# Patient Record
Sex: Female | Born: 2000
Health system: Southern US, Community
[De-identification: ages and names within clinical notes are randomized; demographics above are authoritative.]

## PROBLEM LIST (undated history)

## (undated) HISTORY — PX: WISDOM TOOTH EXTRACTION: SHX21

---
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
## 2018-05-27 LAB — LIPID PANEL
Cholesterol: 199 (ref 0–200)
HDL: 71 — AB (ref 35–70)
LDL Cholesterol: 109
Triglycerides: 93 (ref 40–160)

## 2019-05-26 ENCOUNTER — Encounter: Payer: Self-pay | Admitting: Family Medicine

## 2019-05-26 ENCOUNTER — Other Ambulatory Visit: Payer: Self-pay

## 2019-05-26 ENCOUNTER — Ambulatory Visit (INDEPENDENT_AMBULATORY_CARE_PROVIDER_SITE_OTHER): Payer: BC Managed Care – PPO | Admitting: Family Medicine

## 2019-05-26 VITALS — BP 118/80 | HR 91 | Temp 98.1°F | Ht 60.75 in | Wt 101.2 lb

## 2019-05-26 DIAGNOSIS — Z0000 Encounter for general adult medical examination without abnormal findings: Secondary | ICD-10-CM

## 2019-05-26 NOTE — Progress Notes (Addendum)
Carolyn Palmer DOB: 03-Apr-2001 Encounter date: 05/26/2019  This is a 18 y.o. female who presents to establish care. Chief Complaint  Patient presents with  . Establish Care    History of present illness: Previously only saw pediatrician.   No specific concerns today.   She is going to school for [employer].   She tracks cycle and uses condoms for birth control. Gets bad cramps with periods. Periods are regular. Bleeds for 6 days during cycle with 3 heavier days. Changes q 3 hours on heavy days. Is following with gynecology next month.   She does exercise regularly. Enjoys running.   Did have cholesterol checked last summer through pediatrician.   History reviewed. No pertinent past medical history. Past Surgical History:  Procedure Laterality Date  . WISDOM TOOTH EXTRACTION     No Known Allergies No outpatient medications have been marked as taking for the 05/26/19 encounter (Office Visit) with Caren Macadam, MD.   Social History   Tobacco Use  . Smoking status: Never Smoker  . Smokeless tobacco: Never Used  Substance Use Topics  . Alcohol use: Yes    Comment: hard seltzer every other weekend while in college   Family History  Problem Relation Age of Onset  . Hypertension Mother   . Hypertension Father   . Colon cancer Maternal Grandfather 62  . Diabetes Maternal Grandfather   . Diabetes Paternal Grandmother   . Heart disease Paternal Grandfather   . Healthy Sister   . Healthy Brother      Review of Systems  Constitutional: Negative for activity change, appetite change, chills, fatigue, fever and unexpected weight change.  HENT: Negative for congestion, ear pain, hearing loss, sinus pressure, sinus pain, sore throat and trouble swallowing.   Eyes: Negative for pain and visual disturbance.  Respiratory: Negative for cough, chest tightness, shortness of breath and wheezing.   Cardiovascular: Negative for chest pain, palpitations and leg swelling.   Gastrointestinal: Negative for abdominal pain, blood in stool, constipation, diarrhea, nausea and vomiting.  Genitourinary: Negative for difficulty urinating and menstrual problem.  Musculoskeletal: Negative for arthralgias and back pain.  Skin: Negative for rash.  Neurological: Negative for dizziness, weakness, numbness and headaches.  Hematological: Negative for adenopathy. Does not bruise/bleed easily.  Psychiatric/Behavioral: Negative for sleep disturbance and suicidal ideas. The patient is not nervous/anxious.     Objective:  BP 118/80 (BP Location: Left Arm, Patient Position: Sitting, Cuff Size: Normal)   Pulse 91   Temp 98.1 F (36.7 C) (Oral)   Ht 5' 0.75" (1.543 m)   Wt 101 lb 3.2 oz (45.9 kg)   LMP 05/23/2019 (Exact Date)   SpO2 97%   BMI 19.28 kg/m   Weight: 101 lb 3.2 oz (45.9 kg)   BP Readings from Last 3 Encounters:  05/26/19 118/80   Wt Readings from Last 3 Encounters:  05/26/19 101 lb 3.2 oz (45.9 kg) (4 %, Z= -1.71)*   * Growth percentiles are based on CDC (Girls, 2-20 Years) data.    Physical Exam Vitals signs reviewed.  Constitutional:      General: She is not in acute distress.    Appearance: She is well-developed.  HENT:     Head: Normocephalic and atraumatic.     Right Ear: External ear normal.     Left Ear: External ear normal.     Mouth/Throat:     Pharynx: No oropharyngeal exudate.     Tonsils: 1+ on the right. 2+ on the left.  Eyes:  Conjunctiva/sclera: Conjunctivae normal.     Pupils: Pupils are equal, round, and reactive to light.  Neck:     Musculoskeletal: Normal range of motion and neck supple.     Thyroid: No thyromegaly.  Cardiovascular:     Rate and Rhythm: Normal rate and regular rhythm.     Heart sounds: Normal heart sounds. No murmur. No friction rub. No gallop.   Pulmonary:     Effort: Pulmonary effort is normal.     Breath sounds: Normal breath sounds.  Abdominal:     General: Bowel sounds are normal. There is no  distension.     Palpations: Abdomen is soft. There is no mass.     Tenderness: There is no abdominal tenderness. There is no guarding.     Hernia: No hernia is present.  Musculoskeletal: Normal range of motion.        General: No tenderness or deformity.  Lymphadenopathy:     Cervical: No cervical adenopathy.  Skin:    General: Skin is warm and dry.     Findings: No rash.  Neurological:     Mental Status: She is alert and oriented to person, place, and time.     Deep Tendon Reflexes: Reflexes normal.     Reflex Scores:      Tricep reflexes are 2+ on the right side and 2+ on the left side.      Bicep reflexes are 2+ on the right side and 2+ on the left side.      Brachioradialis reflexes are 2+ on the right side and 2+ on the left side.      Patellar reflexes are 2+ on the right side and 2+ on the left side. Psychiatric:        Speech: Speech normal.        Behavior: Behavior normal.        Thought Content: Thought content normal.    Female exam deferred since she plans to see gyn this month  Assessment/Plan:  1. Preventative health care Healthy young woman with no significant medical history.  She would like to discuss starting birth control with gynecologist and has upcoming appointment for this.  Encouraged her to stay active with regular exercise.  She will sign record release upfront so that we can get blood work she had completed last year, which included cholesterol.  Follow-up in 1 year.  Return in about 1 year (around 05/25/2020) for physical exam.  Micheline Rough, MD

## 2019-06-04 ENCOUNTER — Telehealth: Payer: Self-pay | Admitting: *Deleted

## 2019-06-04 NOTE — Telephone Encounter (Signed)
Copied from [place] [id number]. Topic: General - Other >> Jun 04, 2019  3:15 PM Yvette Rack wrote: Reason for CRM: [place] with [facility] called to see if the office received the fax. Cb# [phone]

## 2019-06-08 NOTE — Telephone Encounter (Signed)
I called [employer] and informed Tiffany our fax has been down and if they received a confirmation the records should be coming through as the machine was fixed today per Joycelyn Schmid.

## 2019-08-10 ENCOUNTER — Encounter: Payer: Self-pay | Admitting: Family Medicine

## 2019-12-27 DIAGNOSIS — Z20828 Contact with and (suspected) exposure to other viral communicable diseases: Secondary | ICD-10-CM | POA: Diagnosis not present

## 2019-12-28 ENCOUNTER — Other Ambulatory Visit: Payer: Self-pay | Admitting: Obstetrics and Gynecology

## 2019-12-28 DIAGNOSIS — N6310 Unspecified lump in the right breast, unspecified quadrant: Secondary | ICD-10-CM

## 2019-12-30 ENCOUNTER — Other Ambulatory Visit: Payer: Self-pay

## 2019-12-30 ENCOUNTER — Other Ambulatory Visit: Payer: Self-pay | Admitting: Obstetrics and Gynecology

## 2019-12-30 ENCOUNTER — Ambulatory Visit
Admission: RE | Admit: 2019-12-30 | Discharge: 2019-12-30 | Disposition: A | Discharge status: Home / Self Care | Payer: BC Managed Care – PPO | Source: Ambulatory Visit | Attending: Obstetrics and Gynecology | Admitting: Obstetrics and Gynecology

## 2019-12-30 DIAGNOSIS — N6310 Unspecified lump in the right breast, unspecified quadrant: Secondary | ICD-10-CM

## 2019-12-30 DIAGNOSIS — N6314 Unspecified lump in the right breast, lower inner quadrant: Secondary | ICD-10-CM | POA: Diagnosis not present

## 2020-01-13 ENCOUNTER — Other Ambulatory Visit: Payer: Self-pay

## 2020-01-13 ENCOUNTER — Ambulatory Visit
Admission: RE | Admit: 2020-01-13 | Discharge: 2020-01-13 | Disposition: A | Discharge status: Home / Self Care | Payer: BC Managed Care – PPO | Source: Ambulatory Visit | Attending: Obstetrics and Gynecology | Admitting: Obstetrics and Gynecology

## 2020-01-13 DIAGNOSIS — N6310 Unspecified lump in the right breast, unspecified quadrant: Secondary | ICD-10-CM

## 2020-01-13 DIAGNOSIS — D241 Benign neoplasm of right breast: Secondary | ICD-10-CM | POA: Diagnosis not present

## 2020-01-13 DIAGNOSIS — N6314 Unspecified lump in the right breast, lower inner quadrant: Secondary | ICD-10-CM | POA: Diagnosis not present

## 2020-01-21 DIAGNOSIS — D241 Benign neoplasm of right breast: Secondary | ICD-10-CM | POA: Diagnosis not present

## 2020-02-04 IMAGING — US US BREAST*R* LIMITED INC AXILLA
1 series · 9 of 9 positions shown · non-contrast
Comparison: None.

CLINICAL DATA: 18-year-old female presenting with a new palpable
lump in the right breast.

EXAM:
ULTRASOUND OF THE RIGHT BREAST

[Series 1: us breast*right* limited inc axilla · 0.08mm/px · 9 of 9 slices shown]
[im 1/9]
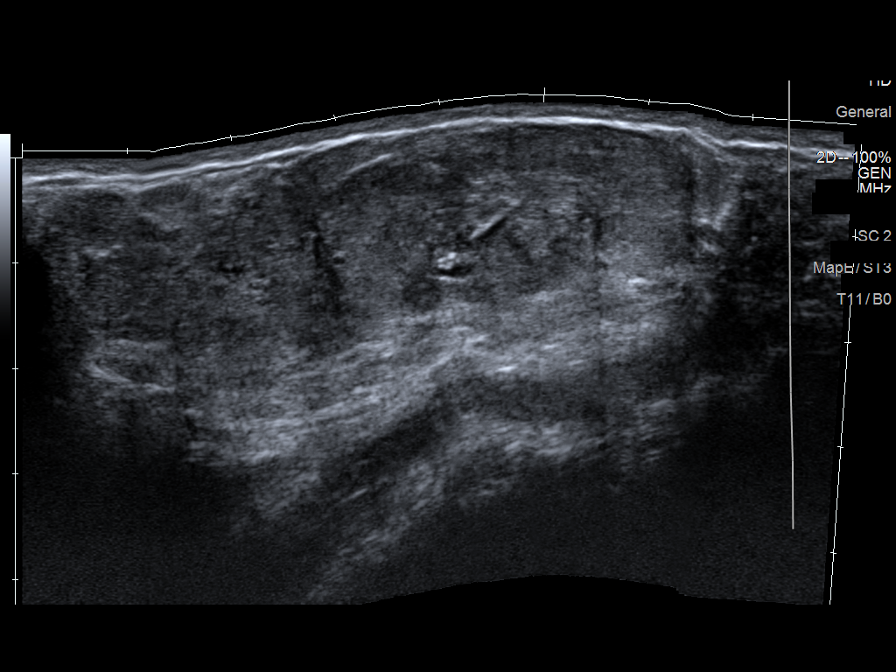
[im 2/9]
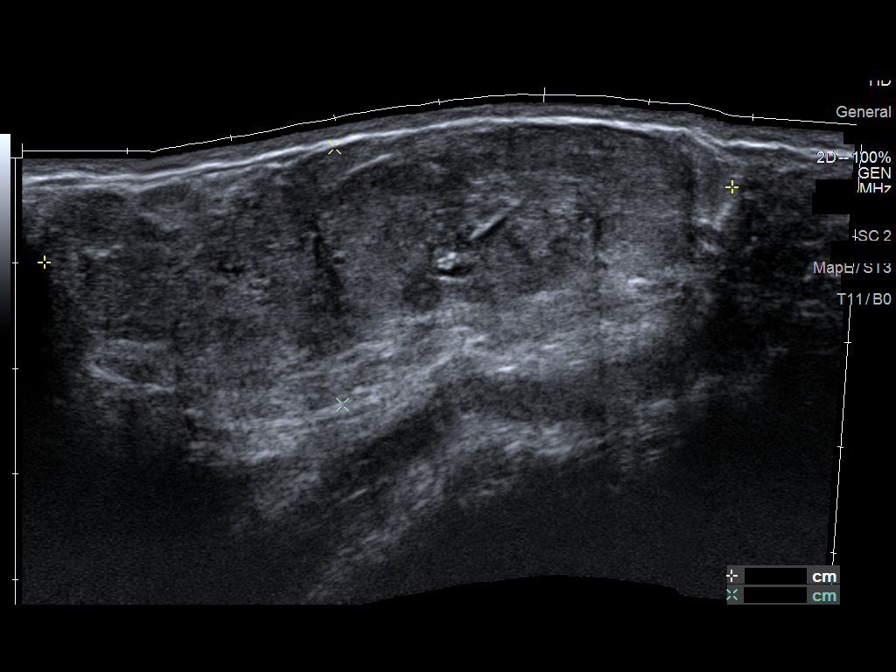
[im 3/9]
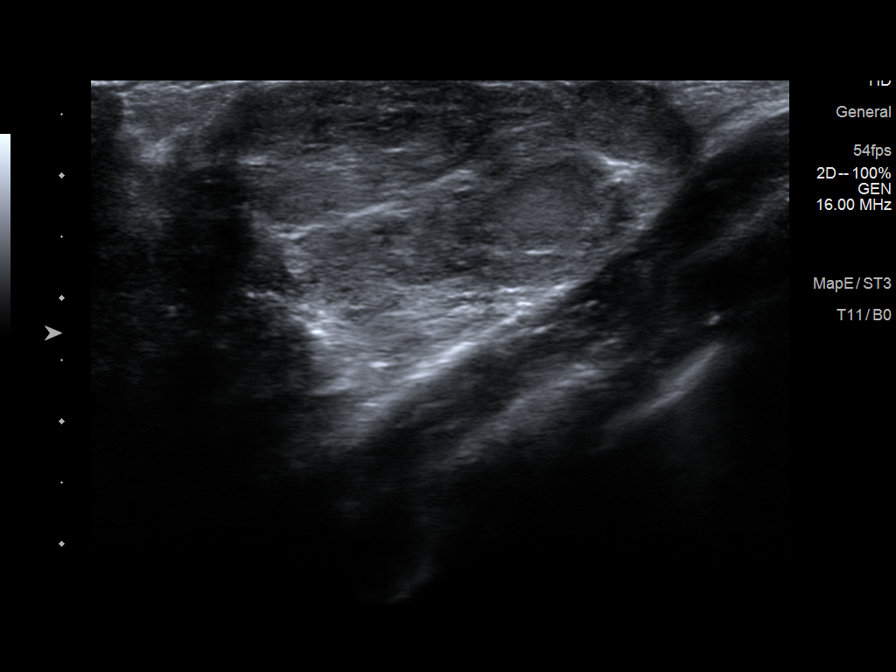
[im 4/9]
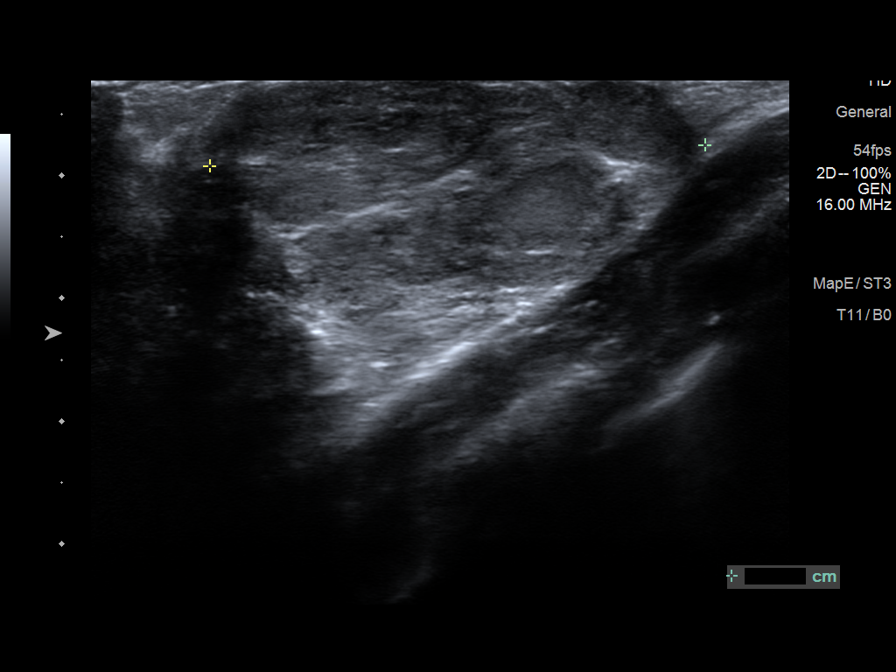
[im 5/9]
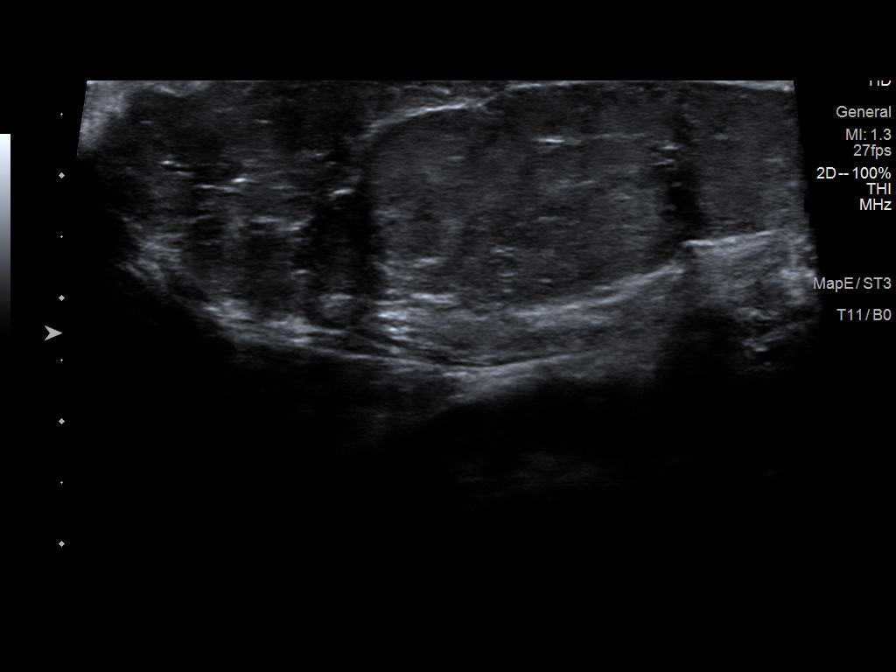
[im 6/9]
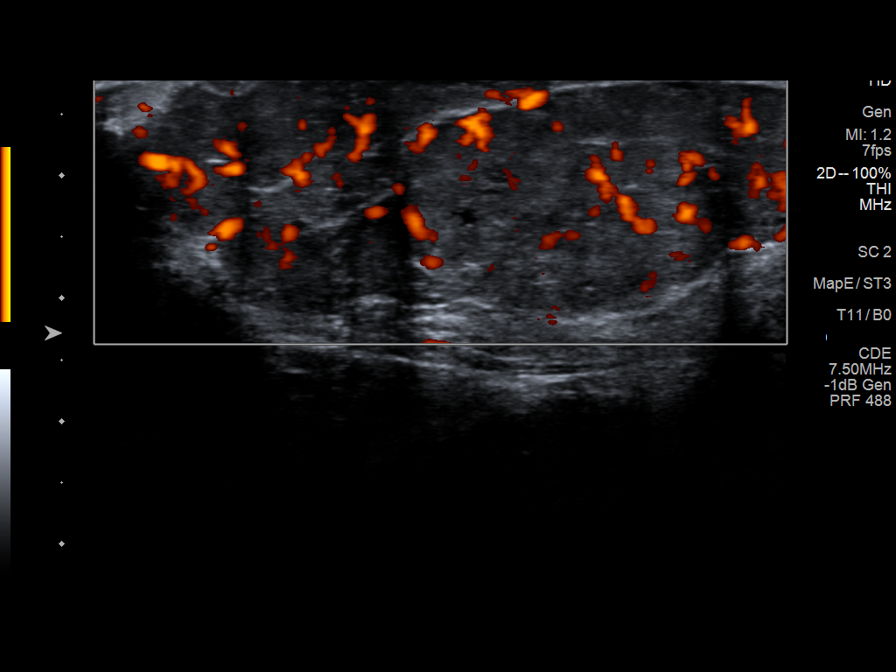
[im 7/9]
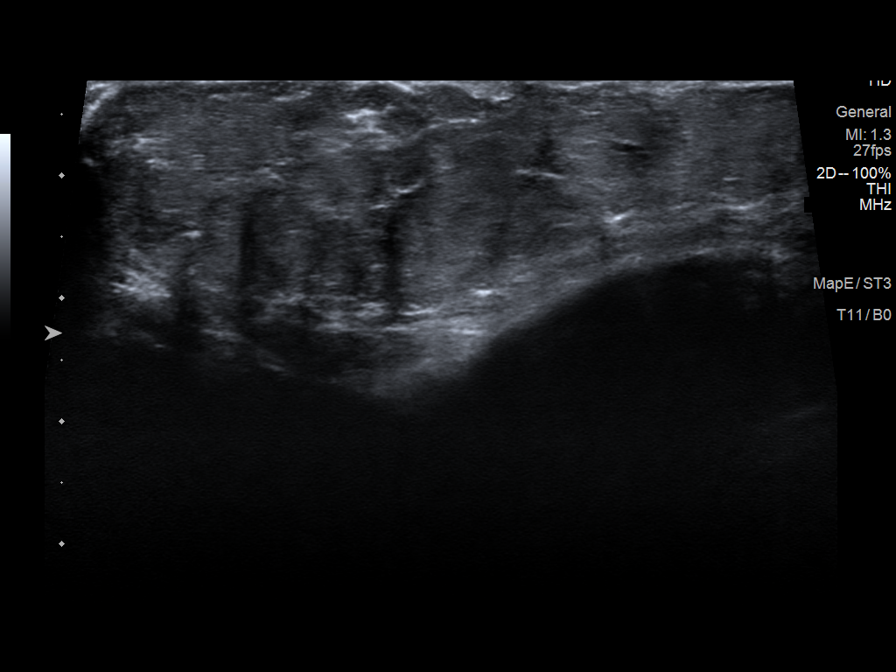
[im 8/9]
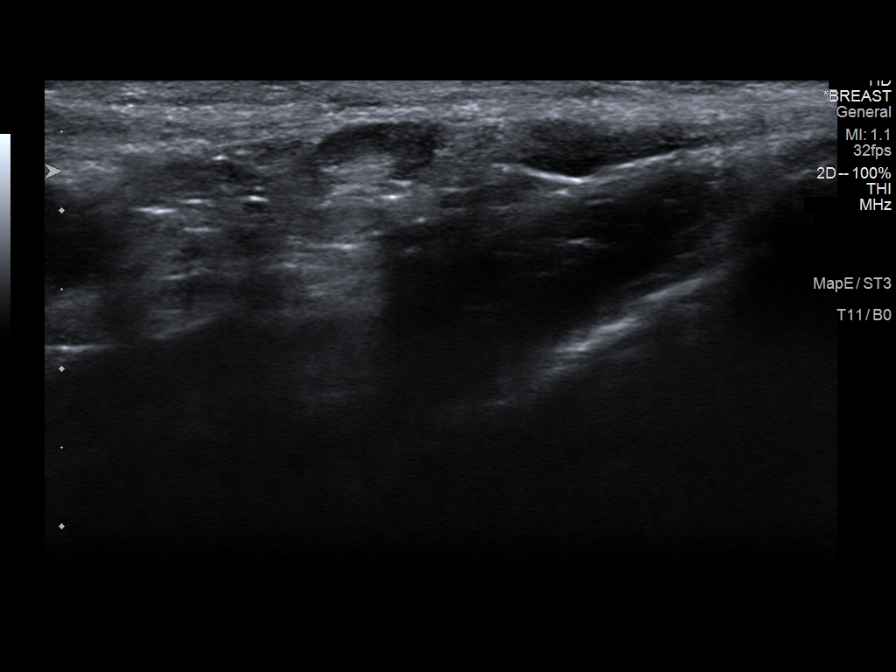
[im 9/9]
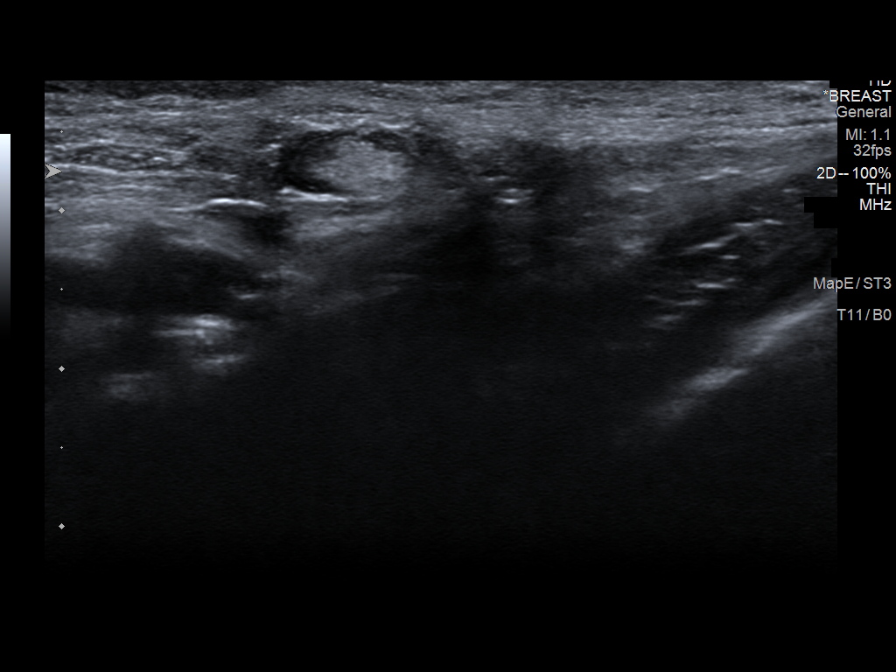

[9 of 9 positions shown; findings below may reference images not displayed]

FINDINGS: On physical exam, I palpate a mobile discrete mass in the inferior
right breast.

Targeted ultrasound is performed at the palpable site of concern in
the right breast at 5 o'clock 7 cm from the nipple demonstrating an
oval circumscribed mass with internal septation and small cystic
spaces, measuring 6.5 x 2.4 x 4.0 cm. There is internal vascularity.
Targeted ultrasound of the right axilla demonstrates no suspicious
appearing lymph nodes.
IMPRESSION: Right breast mass at 5 o'clock measuring 6.5 cm could represent a
fibroadenoma however given reported rapid growth and size of the
mass, tissue sampling is recommended.

RECOMMENDATION:
Ultrasound-guided core needle biopsy of the right breast mass at 5
o'clock. This will be scheduled at the patient's earliest
convenience.

I have discussed the findings and recommendations with the patient.

BI-RADS CATEGORY  4: Suspicious.

## 2020-02-17 ENCOUNTER — Ambulatory Visit (INDEPENDENT_AMBULATORY_CARE_PROVIDER_SITE_OTHER): Payer: BC Managed Care – PPO | Admitting: Professional

## 2020-02-17 DIAGNOSIS — F4322 Adjustment disorder with anxiety: Secondary | ICD-10-CM | POA: Diagnosis not present

## 2020-02-18 IMAGING — US US  BREAST BX W/ LOC DEV 1ST LESION IMG BX SPEC US GUIDE*R*
1 series · 11 of 11 positions shown · non-contrast
Comparison: Previous exam(s).
COMPARISON: Previous exam(s).

Addendum:
CLINICAL DATA: 18-year-old patient presents for ultrasound-guided
biopsy of a 6.5 cm palpable mass in the inferior right breast.

EXAM:
ULTRASOUND GUIDED RIGHT BREAST CORE NEEDLE BIOPSY

[Series 1: us breast bx w/ loc dev 1st lesion img bx spec us  · 0.07mm/px · 11 of 11 slices shown]
[im 1/11]
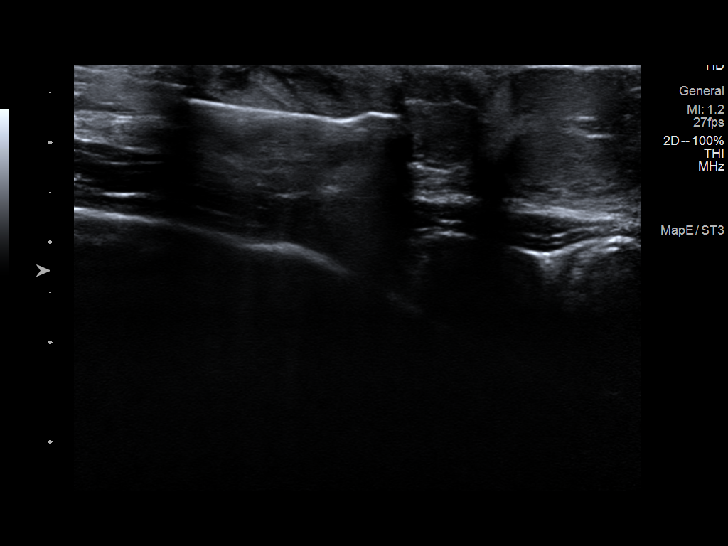
[im 2/11]
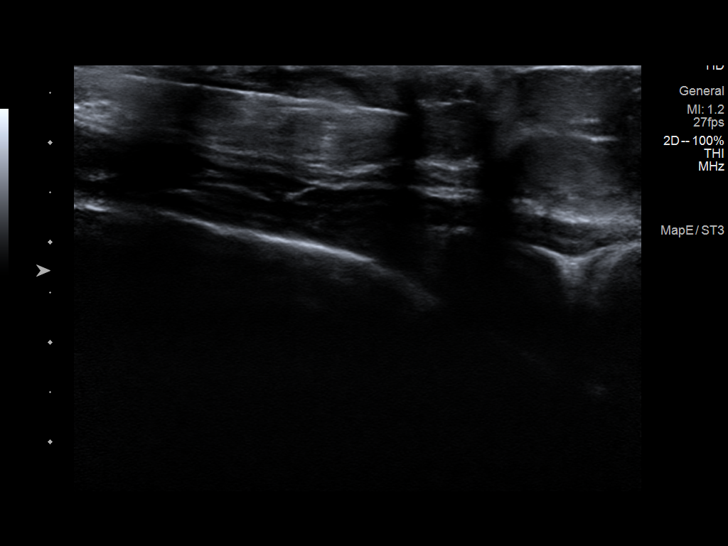
[im 3/11]
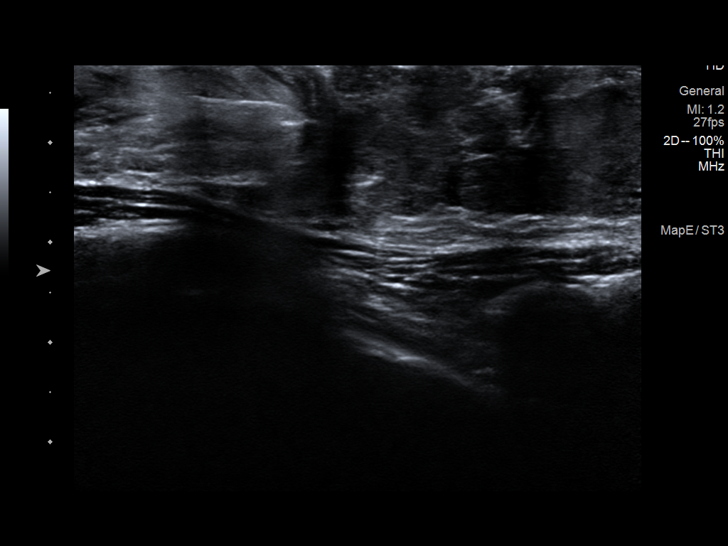
[im 4/11]
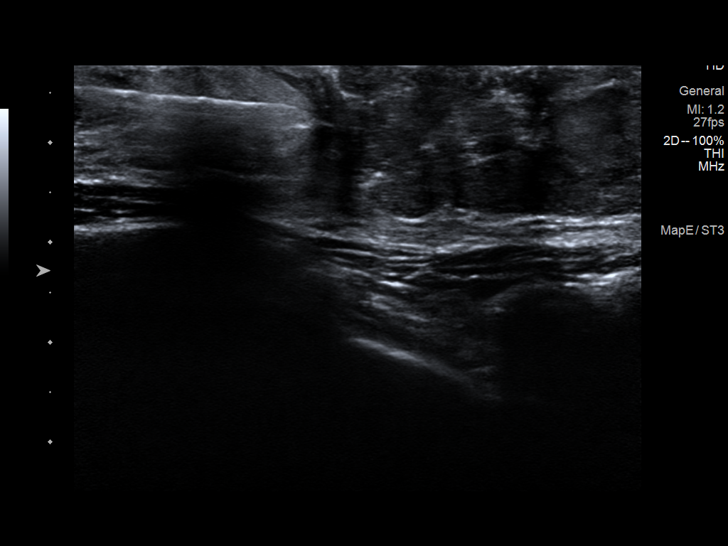
[im 5/11]
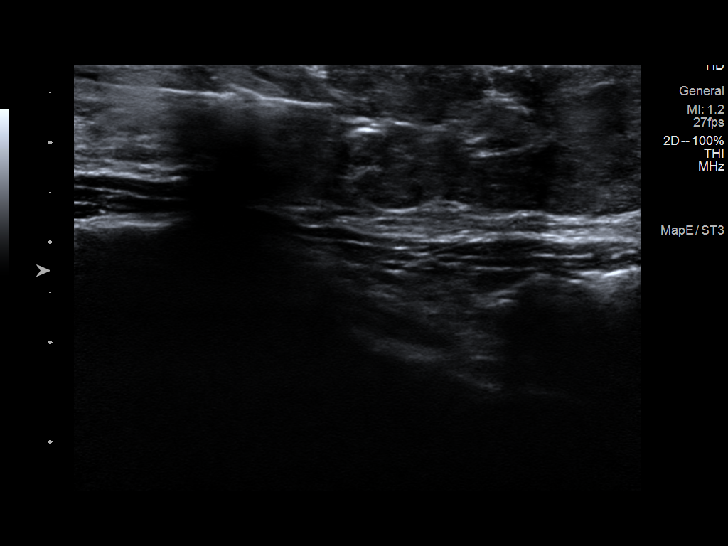
[im 6/11]
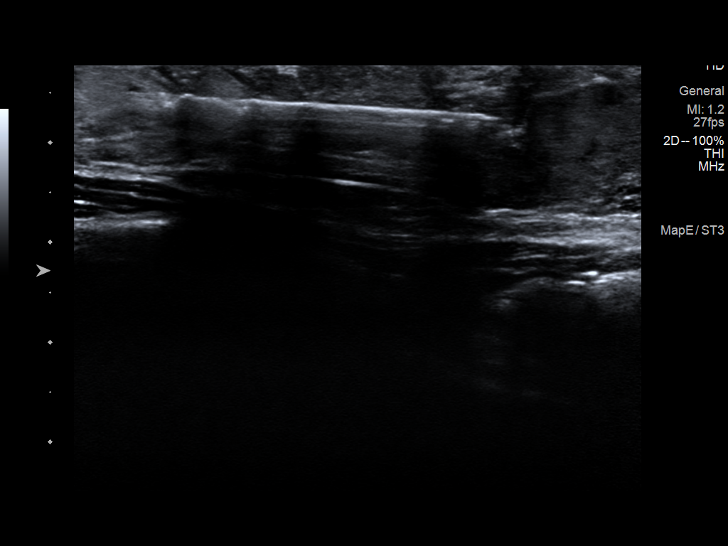
[im 7/11]
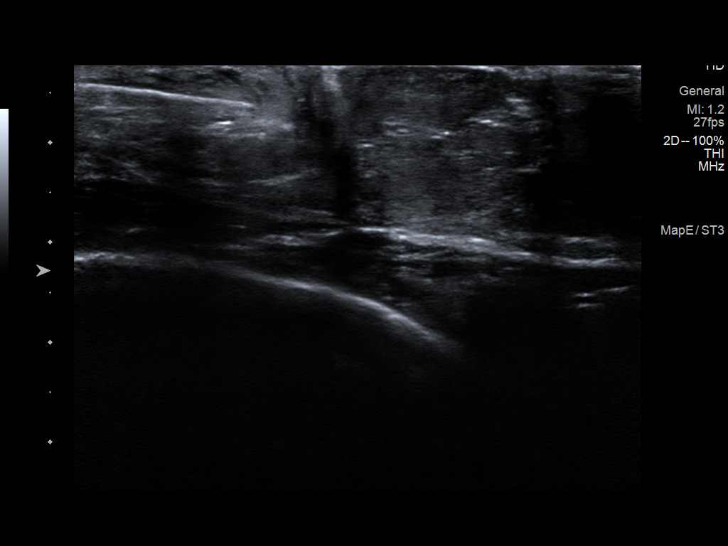
[im 8/11]
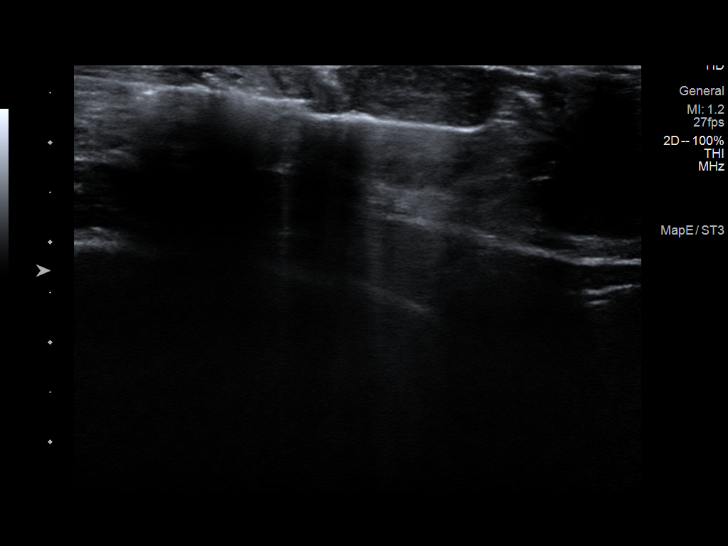
[im 9/11]
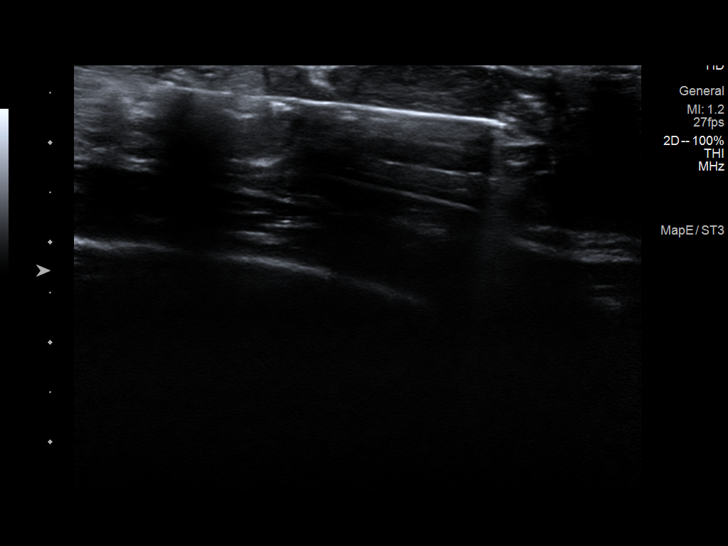
[im 10/11]
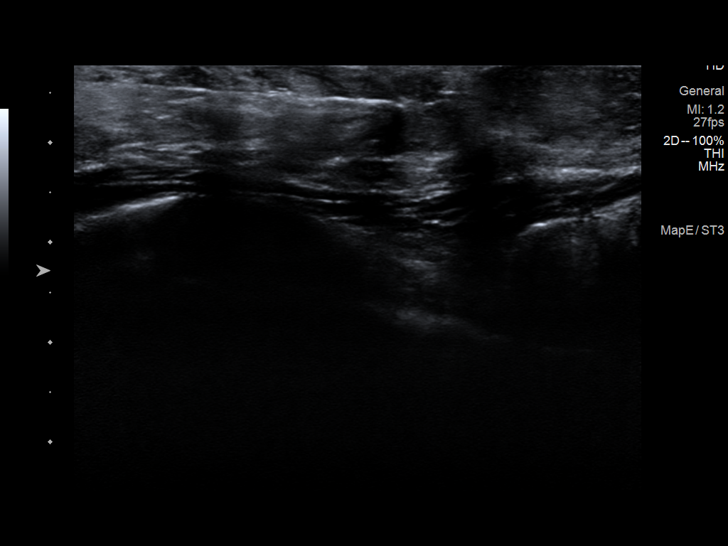
[im 11/11]
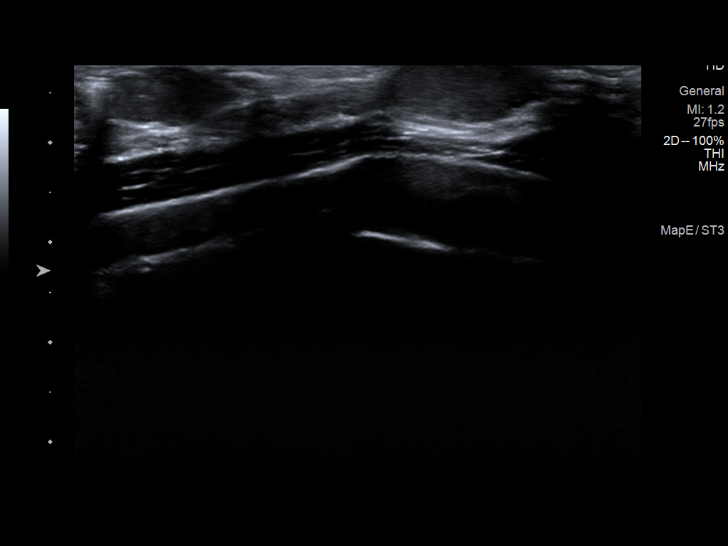

[11 of 11 positions shown; findings below may reference images not displayed]



Lesion quadrant: Lower inner quadrant

Using sterile technique and 1% Lidocaine as local anesthetic, under
direct ultrasound visualization, a 14 gauge Betzy device was
used to perform biopsy of a palpable mass spanning 5 to 7 o'clock
position approximately 7 cm from the nipple using a lateral
approach. At the conclusion of the procedure HydroMARK tissue marker
clip was deployed into the biopsy cavity. Follow up 2 view mammogram
was performed and dictated separately.
IMPRESSION: Ultrasound guided biopsy of a palpable right breast mass. No
apparent complications.

ADDENDUM:
Pathology revealed FIBROADENOMA of the RIGHT breast, inferior 5
o'clock-0 o'clock. This was found to be concordant by Dr. Kam
Onndra.

Pathology results were discussed with the patient by telephone. The
patient reported doing well after the biopsy with tenderness at the
site. Post biopsy instructions and care were reviewed and questions
were answered. The patient was encouraged to call The [REDACTED]

Per patient request, surgical consultation for rapidly enlarging
mass has been arranged with Dr. Marily Pomeroy at [REDACTED] on January 21, 2020.

Pathology results reported by Juz Tiryaki RN on 01/14/2020.



Lesion quadrant: Lower inner quadrant

Using sterile technique and 1% Lidocaine as local anesthetic, under
direct ultrasound visualization, a 14 gauge Betzy device was
used to perform biopsy of a palpable mass spanning 5 to 7 o'clock
position approximately 7 cm from the nipple using a lateral
approach. At the conclusion of the procedure HydroMARK tissue marker
clip was deployed into the biopsy cavity. Follow up 2 view mammogram
was performed and dictated separately.
IMPRESSION: Ultrasound guided biopsy of a palpable right breast mass. No
apparent complications.

## 2020-03-03 ENCOUNTER — Ambulatory Visit: Payer: BC Managed Care – PPO | Admitting: Professional

## 2020-03-06 ENCOUNTER — Ambulatory Visit: Payer: BC Managed Care – PPO | Admitting: Professional

## 2020-03-14 ENCOUNTER — Ambulatory Visit: Payer: BC Managed Care – PPO | Admitting: Professional

## 2020-03-28 ENCOUNTER — Ambulatory Visit: Payer: Self-pay | Admitting: Surgery

## 2020-03-28 DIAGNOSIS — D241 Benign neoplasm of right breast: Secondary | ICD-10-CM

## 2020-03-28 NOTE — H&P (Signed)
KORAYMA HAGWOOD Documented: 03/28/2020 12:15 PM Location: [facility] Patient #: [id number] DOB: 14-Sep-2001 Single / Language: [name] / Race: White Female  History of Present Illness Marcello Moores A. Ashden Sonnenberg MD; 03/28/2020 12:26 PM) Patient words: Patient returns for follow-up of right breast fibroadenoma. She is here today to set up right breast lumpectomy. She thinks is A little bit larger since I last saw HER-2 months ago. She has no other complaints.  The patient is a 19 year old female.   Allergies (Chanel Teressa Senter, CMA; 03/28/2020 12:15 PM) No Known Allergies [01/21/2020]: No Known Drug Allergies [01/21/2020]: Allergies Reconciled  Medication History (Chanel Teressa Senter, [place]; 03/28/2020 12:15 PM) Medications Reconciled    Vitals (Chanel Nolan CMA; 03/28/2020 12:15 PM) 03/28/2020 12:15 PM Weight: 93.38 lb Height: 61in Body Surface Area: 1.37 m Body Mass Index: 17.64 kg/m  Temp.: 99.52F  Pulse: 70 (Regular)         Physical Exam (Laury Huizar A. Lydiana Milley MD; 03/28/2020 12:27 PM)  General Mental Status-Alert. General Appearance-Consistent with stated age. Hydration-Well hydrated. Voice-Normal.  Chest and Lung Exam Note: Lung sounds are clear bilaterally  Breast Note: Multiple right breast mass lower pole measures about 5 cm. Mobile.  Cardiovascular Note: Normal sinus rhythm  Neurologic Neurologic evaluation reveals -alert and oriented x 3 with no impairment of recent or remote memory. Mental Status-Normal.    Assessment & Plan (Sidonia Nutter A. Malillany Kazlauskas MD; 03/28/2020 12:27 PM)  Rodman Comp, RIGHT (D24.1) Impression: right breast seed lumpectomy it has grown over last 2 months slightly Risk of lumpectomy include bleeding, infection, seroma, more surgery, use of seed/wire, wound care, cosmetic deformity and the need for other treatments, death , blood clots, death. Pt agrees to proceed.  Total time 20 minutes  Current Plans You are being  scheduled for surgery- Our schedulers will call you.  You should hear from our office's scheduling department within 5 working days about the location, date, and time of surgery. We try to make accommodations for patient's preferences in scheduling surgery, but sometimes the OR schedule or the surgeon's schedule prevents [place] from making those accommodations.  If you have not heard from our office [phone]) in 5 working days, call the office and ask for your surgeon's nurse.  If you have other questions about your diagnosis, plan, or surgery, call the office and ask for your surgeon's nurse.  Pt Education - [facility] Breast Biopsy HCI: discussed with patient and provided information.

## 2020-04-11 ENCOUNTER — Other Ambulatory Visit: Payer: Self-pay | Admitting: Surgery

## 2020-04-11 DIAGNOSIS — D241 Benign neoplasm of right breast: Secondary | ICD-10-CM

## 2020-05-02 ENCOUNTER — Other Ambulatory Visit: Payer: Self-pay

## 2020-05-02 ENCOUNTER — Encounter (HOSPITAL_BASED_OUTPATIENT_CLINIC_OR_DEPARTMENT_OTHER): Payer: Self-pay | Admitting: Surgery

## 2020-05-05 ENCOUNTER — Other Ambulatory Visit (HOSPITAL_COMMUNITY)
Admission: RE | Admit: 2020-05-05 | Discharge: 2020-05-05 | Disposition: A | Discharge status: Home / Self Care | Payer: BC Managed Care – PPO | Source: Ambulatory Visit | Attending: Surgery | Admitting: Surgery

## 2020-05-05 DIAGNOSIS — Z20822 Contact with and (suspected) exposure to covid-19: Secondary | ICD-10-CM | POA: Insufficient documentation

## 2020-05-05 DIAGNOSIS — Z01812 Encounter for preprocedural laboratory examination: Secondary | ICD-10-CM | POA: Insufficient documentation

## 2020-05-05 LAB — SARS CORONAVIRUS 2 (TAT 6-24 HRS): SARS Coronavirus 2: NEGATIVE

## 2020-05-08 ENCOUNTER — Ambulatory Visit
Admission: RE | Admit: 2020-05-08 | Discharge: 2020-05-08 | Disposition: A | Discharge status: Home / Self Care | Payer: BC Managed Care – PPO | Source: Ambulatory Visit | Attending: Surgery | Admitting: Surgery

## 2020-05-08 ENCOUNTER — Other Ambulatory Visit: Payer: Self-pay

## 2020-05-08 ENCOUNTER — Other Ambulatory Visit: Payer: Self-pay | Admitting: Surgery

## 2020-05-08 DIAGNOSIS — D241 Benign neoplasm of right breast: Secondary | ICD-10-CM | POA: Diagnosis not present

## 2020-05-09 ENCOUNTER — Ambulatory Visit (HOSPITAL_BASED_OUTPATIENT_CLINIC_OR_DEPARTMENT_OTHER): Payer: BC Managed Care – PPO | Admitting: Anesthesiology

## 2020-05-09 ENCOUNTER — Encounter (HOSPITAL_BASED_OUTPATIENT_CLINIC_OR_DEPARTMENT_OTHER)
Admission: RE | Disposition: A | Discharge status: Home / Self Care | Payer: Self-pay | Source: Home / Self Care | Attending: Surgery

## 2020-05-09 ENCOUNTER — Ambulatory Visit (HOSPITAL_BASED_OUTPATIENT_CLINIC_OR_DEPARTMENT_OTHER)
Admission: RE | Admit: 2020-05-09 | Discharge: 2020-05-09 | Disposition: A | Discharge status: Home / Self Care | Payer: BC Managed Care – PPO | Attending: Surgery | Admitting: Surgery

## 2020-05-09 ENCOUNTER — Other Ambulatory Visit: Payer: Self-pay

## 2020-05-09 ENCOUNTER — Ambulatory Visit
Admission: RE | Admit: 2020-05-09 | Discharge: 2020-05-09 | Disposition: A | Discharge status: Home / Self Care | Payer: BC Managed Care – PPO | Source: Ambulatory Visit | Attending: Surgery | Admitting: Surgery

## 2020-05-09 ENCOUNTER — Encounter (HOSPITAL_BASED_OUTPATIENT_CLINIC_OR_DEPARTMENT_OTHER): Payer: Self-pay | Admitting: Surgery

## 2020-05-09 DIAGNOSIS — D241 Benign neoplasm of right breast: Secondary | ICD-10-CM

## 2020-05-09 DIAGNOSIS — N6310 Unspecified lump in the right breast, unspecified quadrant: Secondary | ICD-10-CM | POA: Diagnosis not present

## 2020-05-09 HISTORY — PX: BREAST LUMPECTOMY WITH RADIOACTIVE SEED LOCALIZATION: SHX6424

## 2020-05-09 SURGERY — BREAST LUMPECTOMY WITH RADIOACTIVE SEED LOCALIZATION
Anesthesia: General | Site: Breast | Laterality: Right

## 2020-05-09 MED ORDER — CHLORHEXIDINE GLUCONATE CLOTH 2 % EX PADS: 6.0000 | MEDICATED_PAD | Freq: Once | CUTANEOUS | Status: DC

## 2020-05-09 MED ORDER — HYDROCODONE-ACETAMINOPHEN 5-325 MG PO TABS
1.0000 | ORAL_TABLET | Freq: Four times a day (QID) | ORAL | 0 refills | Status: DC | PRN
Start: 2020-05-09 — End: 2021-07-20

## 2020-05-09 MED ORDER — MIDAZOLAM HCL 2 MG/2ML IJ SOLN
INTRAMUSCULAR | Status: AC
  Filled 2020-05-09: qty 2

## 2020-05-09 MED ORDER — CEFAZOLIN SODIUM-DEXTROSE 2-4 GM/100ML-% IV SOLN
INTRAVENOUS | Status: AC
  Filled 2020-05-09: qty 100

## 2020-05-09 MED ORDER — PROMETHAZINE HCL 25 MG/ML IJ SOLN
INTRAMUSCULAR | Status: AC
  Filled 2020-05-09: qty 1

## 2020-05-09 MED ORDER — ACETAMINOPHEN 500 MG PO TABS
ORAL_TABLET | ORAL | Status: AC
  Filled 2020-05-09: qty 2

## 2020-05-09 MED ORDER — FENTANYL CITRATE (PF) 100 MCG/2ML IJ SOLN
INTRAMUSCULAR | Status: DC | PRN
  Administered 2020-05-09: 100 ug via INTRAVENOUS

## 2020-05-09 MED ORDER — ONDANSETRON HCL 4 MG/2ML IJ SOLN
INTRAMUSCULAR | Status: DC | PRN
  Administered 2020-05-09: 4 mg via INTRAVENOUS

## 2020-05-09 MED ORDER — PROMETHAZINE HCL 25 MG/ML IJ SOLN
6.2500 mg | Freq: Once | INTRAMUSCULAR | Status: AC
  Administered 2020-05-09: 6.25 mg via INTRAVENOUS

## 2020-05-09 MED ORDER — HYDROMORPHONE HCL 1 MG/ML IJ SOLN: 0.2500 mg | INTRAMUSCULAR | Status: DC | PRN

## 2020-05-09 MED ORDER — ACETAMINOPHEN 500 MG PO TABS
1000.0000 mg | ORAL_TABLET | ORAL | Status: AC
  Administered 2020-05-09: 1000 mg via ORAL

## 2020-05-09 MED ORDER — PROPOFOL 500 MG/50ML IV EMUL
INTRAVENOUS | Status: DC | PRN
  Administered 2020-05-09: 25 ug/kg/min via INTRAVENOUS

## 2020-05-09 MED ORDER — FENTANYL CITRATE (PF) 100 MCG/2ML IJ SOLN
INTRAMUSCULAR | Status: AC
  Filled 2020-05-09: qty 2

## 2020-05-09 MED ORDER — DEXAMETHASONE SODIUM PHOSPHATE 4 MG/ML IJ SOLN
INTRAMUSCULAR | Status: DC | PRN
  Administered 2020-05-09: 4 mg via INTRAVENOUS

## 2020-05-09 MED ORDER — LACTATED RINGERS IV SOLN: INTRAVENOUS | Status: DC

## 2020-05-09 MED ORDER — CELECOXIB 200 MG PO CAPS
200.0000 mg | ORAL_CAPSULE | ORAL | Status: AC
  Administered 2020-05-09: 200 mg via ORAL

## 2020-05-09 MED ORDER — BUPIVACAINE HCL (PF) 0.25 % IJ SOLN
INTRAMUSCULAR | Status: DC | PRN
  Administered 2020-05-09: 20 mL

## 2020-05-09 MED ORDER — LIDOCAINE HCL (CARDIAC) PF 100 MG/5ML IV SOSY
PREFILLED_SYRINGE | INTRAVENOUS | Status: DC | PRN
  Administered 2020-05-09: 60 mg via INTRAVENOUS

## 2020-05-09 MED ORDER — CELECOXIB 200 MG PO CAPS
ORAL_CAPSULE | ORAL | Status: AC
  Filled 2020-05-09: qty 1

## 2020-05-09 MED ORDER — PROPOFOL 10 MG/ML IV BOLUS
INTRAVENOUS | Status: DC | PRN
  Administered 2020-05-09: 150 mg via INTRAVENOUS

## 2020-05-09 MED ORDER — IBUPROFEN 800 MG PO TABS
800.0000 mg | ORAL_TABLET | Freq: Three times a day (TID) | ORAL | 0 refills | Status: AC | PRN
Start: 2020-05-09 — End: ?

## 2020-05-09 MED ORDER — MIDAZOLAM HCL 5 MG/5ML IJ SOLN
INTRAMUSCULAR | Status: DC | PRN
  Administered 2020-05-09: 2 mg via INTRAVENOUS

## 2020-05-09 MED ORDER — CEFAZOLIN SODIUM-DEXTROSE 2-4 GM/100ML-% IV SOLN
2.0000 g | INTRAVENOUS | Status: AC
  Administered 2020-05-09: 2 g via INTRAVENOUS

## 2020-05-09 SURGICAL SUPPLY — 35 items
BINDER BREAST MEDIUM (GAUZE/BANDAGES/DRESSINGS) ×2 IMPLANT
BLADE SURG 15 STRL LF DISP TIS (BLADE) ×1 IMPLANT
BLADE SURG 15 STRL SS (BLADE) ×2
CHLORAPREP W/TINT 26 (MISCELLANEOUS) ×3 IMPLANT
COVER BACK TABLE 60X90IN (DRAPES) ×3 IMPLANT
COVER MAYO STAND STRL (DRAPES) ×3 IMPLANT
COVER PROBE W GEL 5X96 (DRAPES) ×3 IMPLANT
DERMABOND ADVANCED (GAUZE/BANDAGES/DRESSINGS) ×2
DERMABOND ADVANCED .7 DNX12 (GAUZE/BANDAGES/DRESSINGS) ×1 IMPLANT
DRAPE LAPAROTOMY 100X72 PEDS (DRAPES) ×3 IMPLANT
DRAPE UTILITY XL STRL (DRAPES) ×3 IMPLANT
ELECT COATED BLADE 2.86 ST (ELECTRODE) ×3 IMPLANT
ELECT REM PT RETURN 9FT ADLT (ELECTROSURGICAL) ×3
ELECTRODE REM PT RTRN 9FT ADLT (ELECTROSURGICAL) ×1 IMPLANT
GLOVE BIO SURGEON STRL SZ7 (GLOVE) ×2 IMPLANT
GLOVE BIOGEL PI IND STRL 7.5 (GLOVE) IMPLANT
GLOVE BIOGEL PI IND STRL 8 (GLOVE) ×1 IMPLANT
GLOVE BIOGEL PI INDICATOR 7.5 (GLOVE) ×2
GLOVE BIOGEL PI INDICATOR 8 (GLOVE) ×2
GLOVE ECLIPSE 8.0 STRL XLNG CF (GLOVE) ×3 IMPLANT
GOWN STRL REUS W/ TWL LRG LVL3 (GOWN DISPOSABLE) ×2 IMPLANT
GOWN STRL REUS W/TWL LRG LVL3 (GOWN DISPOSABLE) ×4
KIT MARKER MARGIN INK (KITS) ×3 IMPLANT
NDL HYPO 25X1 1.5 SAFETY (NEEDLE) ×1 IMPLANT
NEEDLE HYPO 25X1 1.5 SAFETY (NEEDLE) ×3 IMPLANT
NS IRRIG 1000ML POUR BTL (IV SOLUTION) ×3 IMPLANT
PENCIL SMOKE EVACUATOR (MISCELLANEOUS) ×3 IMPLANT
SET BASIN DAY SURGERY F.S. (CUSTOM PROCEDURE TRAY) ×3 IMPLANT
SLEEVE SCD COMPRESS KNEE MED (MISCELLANEOUS) ×3 IMPLANT
SPONGE LAP 4X18 RFD (DISPOSABLE) ×3 IMPLANT
SUT MNCRL AB 4-0 PS2 18 (SUTURE) ×3 IMPLANT
SUT VICRYL 3-0 CR8 SH (SUTURE) ×3 IMPLANT
SYR CONTROL 10ML LL (SYRINGE) ×3 IMPLANT
TOWEL GREEN STERILE FF (TOWEL DISPOSABLE) ×3 IMPLANT
TRAY FAXITRON CT DISP (TRAY / TRAY PROCEDURE) ×3 IMPLANT

## 2020-05-09 NOTE — Interval H&P Note (Signed)
History and Physical Interval Note:  05/09/2020 7:49 AM  Carolyn Palmer  has presented today for surgery, with the diagnosis of FIBROADENOMA RIGHT BREAST.  The various methods of treatment have been discussed with the patient and family. After consideration of risks, benefits and other options for treatment, the patient has consented to  Procedure(s): right breast seed localized lumpectomy (Right) as a surgical intervention.  The patient's history has been reviewed, patient examined, no change in status, stable for surgery.  I have reviewed the patient's chart and labs.  Questions were answered to the patient's satisfaction.     [place]

## 2020-05-09 NOTE — Transfer of Care (Signed)
Immediate Anesthesia Transfer of Care Note  Patient: Carolyn Palmer  Procedure(s) Performed: right breast seed localized lumpectomy (Right Breast)  Patient Location: PACU  Anesthesia Type:General  Level of Consciousness: drowsy and patient cooperative  Airway & Oxygen Therapy: Patient Spontanous Breathing and Patient connected to face mask oxygen  Post-op Assessment: Report given to RN and Post -op Vital signs reviewed and stable  Post vital signs: Reviewed and stable  Last Vitals:  Vitals Value Taken Time  BP    Temp    Pulse    Resp    SpO2      Last Pain:  Vitals:   05/09/20 0729  TempSrc: Oral  PainSc: 0-No pain         Complications: No apparent anesthesia complications

## 2020-05-09 NOTE — Anesthesia Procedure Notes (Signed)
Procedure Name: LMA Insertion Date/Time: 05/09/2020 8:02 AM Performed by: Signe Colt, CRNA Pre-anesthesia Checklist: Patient identified, Emergency Drugs available, Suction available and Patient being monitored Patient Re-evaluated:Patient Re-evaluated prior to induction Oxygen Delivery Method: Circle system utilized Preoxygenation: Pre-oxygenation with 100% oxygen Induction Type: IV induction Ventilation: Mask ventilation without difficulty LMA: LMA inserted LMA Size: 4.0 Number of attempts: 1 Airway Equipment and Method: Bite block Placement Confirmation: positive ETCO2 Tube secured with: Tape Dental Injury: Teeth and Oropharynx as per pre-operative assessment

## 2020-05-09 NOTE — Anesthesia Postprocedure Evaluation (Signed)
Anesthesia Post Note  Patient: Carolyn Palmer  Procedure(s) Performed: right breast seed localized lumpectomy (Right Breast)     Patient location during evaluation: PACU Anesthesia Type: General Level of consciousness: awake Pain management: pain level controlled Vital Signs Assessment: post-procedure vital signs reviewed and stable Respiratory status: spontaneous breathing Cardiovascular status: stable Postop Assessment: no apparent nausea or vomiting Anesthetic complications: no    Last Vitals:  Vitals:   05/09/20 1015 05/09/20 1046  BP: 107/76 122/79  Pulse: (!) 53 65  Resp: 16 18  Temp:  37.3 C  SpO2: 100% 100%    Last Pain:  Vitals:   05/09/20 1046  TempSrc:   PainSc: 0-No pain                 Rustin Erhart

## 2020-05-09 NOTE — H&P (Signed)
Carolyn Palmer Location: [facility] Patient #: [id number] DOB: [birth year] Single / Language: Carolyn Palmer / Race: White Female  History of Present Illness Patient words: Patient returns for follow-up of right breast fibroadenoma. She is here today to set up right breast lumpectomy. She thinks is A little bit larger since I last saw her 2 months ago. She has no other complaints.  The patient is a 19 year old female.   Allergies  No Known Allergies [01/21/2020]: No Known Drug Allergies [01/21/2020]: Allergies Reconciled  Medication History  Medications Reconciled    Vitals  03/28/2020 12:15 PM Weight: 93.38 lb Height: 61in Body Surface Area: 1.37 m Body Mass Index: 17.64 kg/m  Temp.: 99.15F  Pulse: 70 (Regular)         Physical Exam   General Mental Status-Alert. General Appearance-Consistent with stated age. Hydration-Well hydrated. Voice-Normal.  Chest and Lung Exam Note: Lung sounds are clear bilaterally  Breast Note: MOBILE  right breast mass lower pole measures about 5 cm. Mobile.  Cardiovascular Note: Normal sinus rhythm  Neurologic Neurologic evaluation reveals -alert and oriented x 3 with no impairment of recent or remote memory. Mental Status-Normal.    Assessment & Plan  FIBROADENOMA, RIGHT (D24.1) Impression: right breast seed lumpectomy it has grown over last 2 months slightly Risk of lumpectomy include bleeding, infection, seroma, more surgery, use of seed/wire, wound care, cosmetic deformity and the need for other treatments, death , blood clots, death. Pt agrees to proceed.  Total time 20 minutes  Current Plans You are being scheduled for surgery- Our schedulers will call you.  You should hear from our office's scheduling department within 5 working days about the location, date, and time of surgery. We try to make accommodations for patient's preferences in scheduling  surgery, but sometimes the OR schedule or the surgeon's schedule prevents [place] from making those accommodations.  If you have not heard from our office [phone]) in 5 working days, call the office and ask for your surgeon's nurse.  If you have other questions about your diagnosis, plan, or surgery, call the office and ask for your surgeon's nurse.  Pt Education - [facility] Breast Biopsy HCI: discussed with patient and provided information.

## 2020-05-09 NOTE — Op Note (Addendum)
Preoperative diagnosis: Large right breast fibroadenoma ( 5cm)  Postoperative diagnosis: Same  Procedure: Right breast seed localized lumpectomy  Surgeon: Erroll Luna, MD  Anesthesia: LMA with 0.25% Sensorcaine local  EBL: Minimal  Specimen: Large right breast mass with seed and clip verified by Faxitron  Drains: None  IV fluids: Per anesthesia record  Indications for procedure: The patient presents for excision of a large right breast fibroadenoma that is been growing.  It is causing pain and discomfort was verified by initial core biopsy but this is changed in size.  She desired excision due to increasing size.The procedure has been discussed with the patient. Alternatives to surgery have been discussed with the patient.  Risks of surgery include bleeding,  Infection,  Seroma formation, death,  and the need for further surgery.   The patient understands and wishes to proceed.   Description of procedure: The patient was met in the holding area and questions were answered.  Neoprobe used to identify seed right breast.  Films available for review.  Questions were answered.  She was then taken back to the operative room.  She is placed supine upon the OR table.  After induction of general esthesia the right breast was prepped and draped in sterile fashion timeout performed.  Of note the seed was verified in the holding area.  Proper patient, site and procedure were verified.  The mass involved the lower pole of the right breast.  I elected to use an inferior mammary incision through the inframammary fold for access.  Incision was made.  Dissection was carried and the mass was easily identified with the neoprobe cell.  The entire mass was excised with a grossly negative margin.  This was verified by the Faxitron.  Wound is hemostatic with cautery and then closed with 3-0 Vicryl and 4 Monocryl.  Quarter percent Marcaine injected for pain control.  Dermabond applied.  Breast binder placed.  All  counts found to be correct.  The patient was awoke extubated taken recovery in satisfactory condition.

## 2020-05-09 NOTE — Anesthesia Preprocedure Evaluation (Signed)
Anesthesia Evaluation  Patient identified by MRN, date of birth, ID band Patient awake  General Assessment Comment:History noted. CG  Reviewed: Allergy & Precautions, NPO status , Patient's Chart, lab work & pertinent test results  Airway Mallampati: II  TM Distance: >3 FB     Dental   Pulmonary    breath sounds clear to auscultation       Cardiovascular negative cardio ROS   Rhythm:Regular Rate:Normal     Neuro/Psych    GI/Hepatic negative GI ROS, Neg liver ROS,   Endo/Other  negative endocrine ROS  Renal/GU negative Renal ROS     Musculoskeletal   Abdominal   Peds  Hematology   Anesthesia Other Findings   Reproductive/Obstetrics                             Anesthesia Physical Anesthesia Plan  ASA: II  Anesthesia Plan: General   Post-op Pain Management:    Induction: Intravenous  PONV Risk Score and Plan: 3 and Ondansetron, Dexamethasone and Midazolam  Airway Management Planned: LMA  Additional Equipment:   Intra-op Plan:   Post-operative Plan: Extubation in OR  Informed Consent: I have reviewed the patients History and Physical, chart, labs and discussed the procedure including the risks, benefits and alternatives for the proposed anesthesia with the patient or authorized representative who has indicated his/her understanding and acceptance.     Dental advisory given  Plan Discussed with: CRNA and Anesthesiologist  Anesthesia Plan Comments:         Anesthesia Quick Evaluation

## 2020-05-09 NOTE — Discharge Instructions (Signed)
[place] Office Phone Number [phone]  BREAST BIOPSY/ PARTIAL MASTECTOMY: POST OP INSTRUCTIONS  Always review your discharge instruction sheet given to you by the facility where your surgery was performed.  IF YOU HAVE DISABILITY OR FAMILY LEAVE FORMS, YOU MUST BRING THEM TO THE OFFICE FOR PROCESSING.  DO NOT GIVE THEM TO YOUR DOCTOR.  1. A prescription for pain medication may be given to you upon discharge.  Take your pain medication as prescribed, if needed.  If narcotic pain medicine is not needed, then you may take acetaminophen (Tylenol) or ibuprofen (Advil) as needed. 2. Take your usually prescribed medications unless otherwise directed 3. If you need a refill on your pain medication, please contact your pharmacy.  They will contact our office to request authorization.  Prescriptions will not be filled after 5pm or on week-ends. 4. You should eat very light the first 24 hours after surgery, such as soup, crackers, pudding, etc.  Resume your normal diet the day after surgery. 5. Most patients will experience some swelling and bruising in the breast.  Ice packs and a good support bra will help.  Swelling and bruising can take several days to resolve.  6. It is common to experience some constipation if taking pain medication after surgery.  Increasing fluid intake and taking a stool softener will usually help or prevent this problem from occurring.  A mild laxative (Milk of Magnesia or Miralax) should be taken according to package directions if there are no bowel movements after 48 hours. 7. Unless discharge instructions indicate otherwise, you may remove your bandages 24-48 hours after surgery, and you may shower at that time.  You may have steri-strips (small skin tapes) in place directly over the incision.  These strips should be left on the skin for 7-10 days.  If your surgeon used skin glue on the incision, you may shower in 24 hours.  The glue will flake off over the  next 2-3 weeks.  Any sutures or staples will be removed at the office during your follow-up visit. 8. ACTIVITIES:  You may resume regular daily activities (gradually increasing) beginning the next day.  Wearing a good support bra or sports bra minimizes pain and swelling.  You may have sexual intercourse when it is comfortable. a. You may drive when you no longer are taking prescription pain medication, you can comfortably wear a seatbelt, and you can safely maneuver your car and apply brakes. b. RETURN TO WORK:  ______________________________________________________________________________________ 9. You should see your doctor in the office for a follow-up appointment approximately two weeks after your surgery.  Your doctor's nurse will typically make your follow-up appointment when she calls you with your pathology report.  Expect your pathology report 2-3 business days after your surgery.  You may call to check if you do not hear from [place] after three days. 10. OTHER INSTRUCTIONS: _______________________________________________________________________________________________ _____________________________________________________________________________________________________________________________________ _____________________________________________________________________________________________________________________________________ _____________________________________________________________________________________________________________________________________  WHEN TO CALL YOUR DOCTOR: 1. Fever over 101.0 2. Nausea and/or vomiting. 3. Extreme swelling or bruising. 4. Continued bleeding from incision. 5. Increased pain, redness, or drainage from the incision.  The clinic staff is available to answer your questions during regular business hours.  Please don't hesitate to call and ask to speak to one of the nurses for clinical concerns.  If you have a medical emergency, go to the nearest  emergency room or call 911.  A surgeon from [facility] is always on call at the hospital.  For further questions, please visit [email or web address]  Post Anesthesia Home Care Instructions  Activity: Get plenty of rest for the remainder of the day. A responsible individual must stay with you for 24 hours following the procedure.  For the next 24 hours, DO NOT: -Drive a car -Paediatric nurse -Drink alcoholic beverages -Take any medication unless instructed by your physician -Make any legal decisions or sign important papers.  Meals: Start with liquid foods such as gelatin or soup. Progress to regular foods as tolerated. Avoid greasy, spicy, heavy foods. If nausea and/or vomiting occur, drink only clear liquids until the nausea and/or vomiting subsides. Call your physician if vomiting continues.  Special Instructions/Symptoms: Your throat may feel dry or sore from the anesthesia or the breathing tube placed in your throat during surgery. If this causes discomfort, gargle with warm salt water. The discomfort should disappear within 24 hours.  If you had a scopolamine patch placed behind your ear for the management of post- operative nausea and/or vomiting:  1. The medication in the patch is effective for 72 hours, after which it should be removed.  Wrap patch in a tissue and discard in the trash. Wash hands thoroughly with soap and water. 2. You may remove the patch earlier than 72 hours if you experience unpleasant side effects which may include dry mouth, dizziness or visual disturbances. 3. Avoid touching the patch. Wash your hands with soap and water after contact with the patch.   No tylenol until after 1:

## 2020-05-10 ENCOUNTER — Encounter: Payer: Self-pay | Admitting: *Deleted

## 2020-05-10 LAB — SURGICAL PATHOLOGY

## 2020-06-13 IMAGING — US US NEEDLE LOCALIZATION*R*
1 series · 4 of 4 positions shown · non-contrast
Comparison: Previous exam(s).

CLINICAL DATA: 18-year-old female for radioactive seed localization
of enlarging biopsy-proven RIGHT breast fibroadenoma.

EXAM:
ULTRASOUND GUIDED RADIOACTIVE SEED LOCALIZATION OF THE RIGHT BREAST

[Series 1: us needle localization*right* · 0.07mm/px · 4 of 4 slices shown]
[im 1/4]
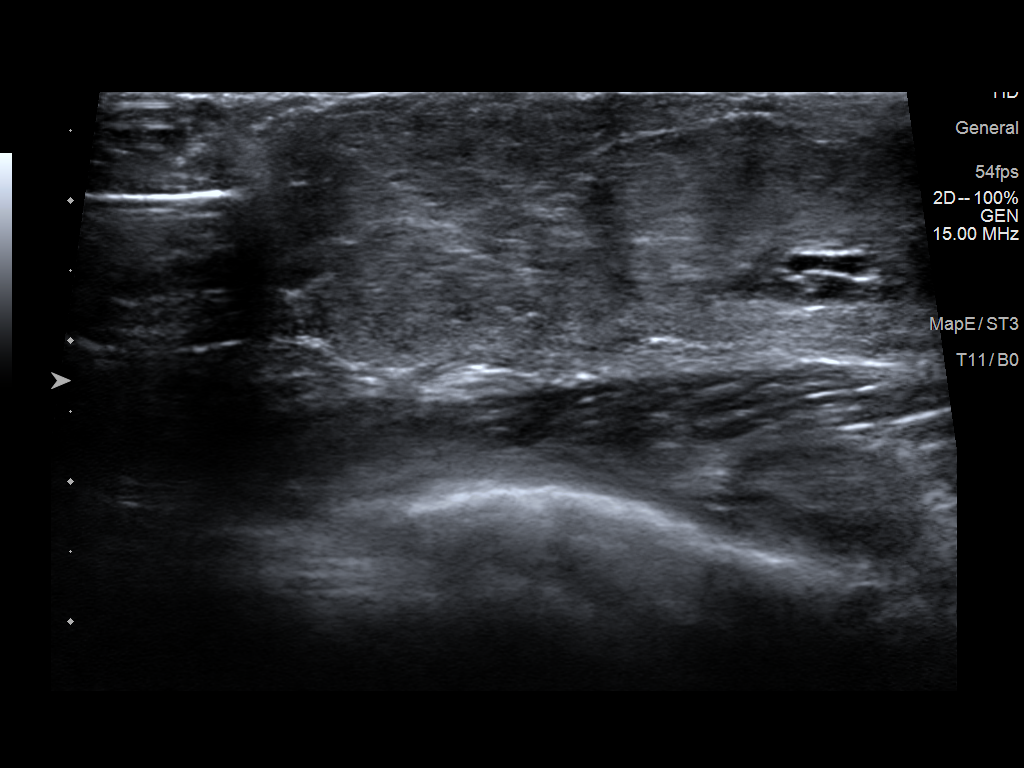
[im 2/4]
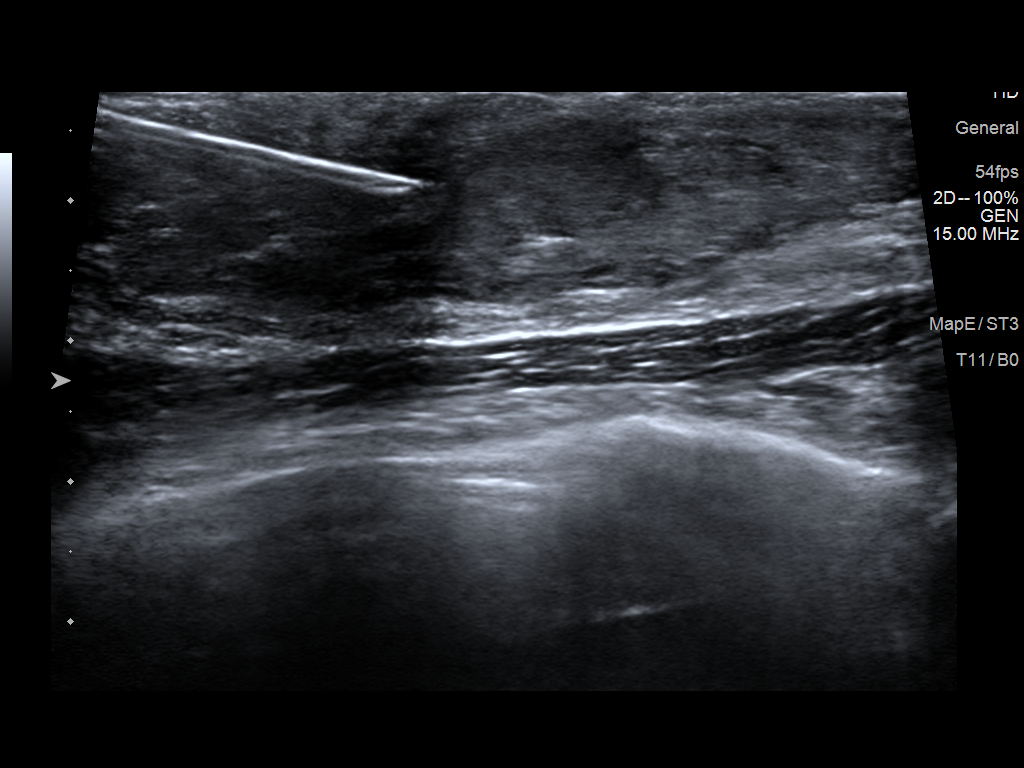
[im 3/4]
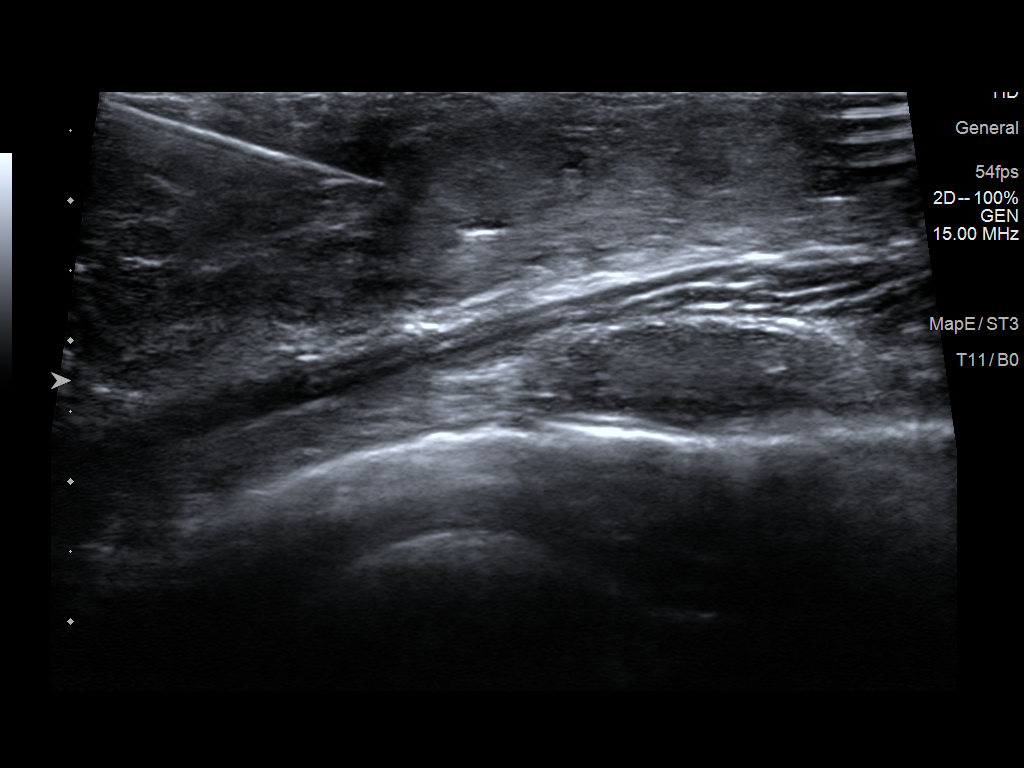
[im 4/4]
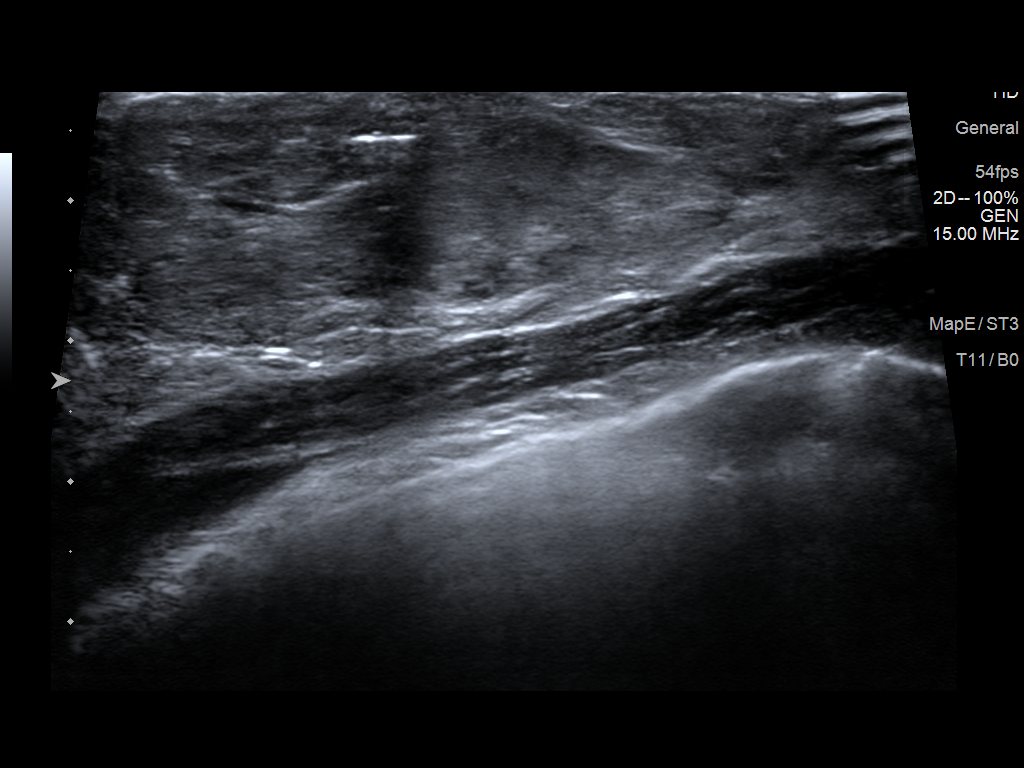

[4 of 4 positions shown; findings below may reference images not displayed]

FINDINGS: Patient presents for radioactive seed localization prior to RIGHT
breast excision. I met with the patient and we discussed the
procedure of seed localization including benefits and alternatives.
We discussed the high likelihood of a successful procedure. We
discussed the risks of the procedure including infection, bleeding,
tissue injury and further surgery. We discussed the low dose of
radioactivity involved in the procedure. Informed, written consent
was given.

The usual time-out protocol was performed immediately prior to the
procedure.

Using ultrasound guidance, sterile technique, 1% lidocaine and an
[id number] radioactive seed, the 6.5 cm mass at the 5 o'clock position of
the RIGHT breast was localized using a MEDIAL approach. Satisfactory
seed placement was confirmed sonographically.

Follow-up survey of the patient confirms presence of the radioactive
seed.

Order number of R-M83 seed:  [id number].

Total activity:  0.247 millicuries.  Reference Date: 04/12/2020.

The patient tolerated the procedure well and was released from the
[REDACTED]. She was given instructions regarding seed removal.
IMPRESSION: Radioactive seed localization RIGHT breast. No apparent
complications.

## 2020-09-08 DIAGNOSIS — Z01419 Encounter for gynecological examination (general) (routine) without abnormal findings: Secondary | ICD-10-CM | POA: Diagnosis not present

## 2020-09-08 DIAGNOSIS — Z1389 Encounter for screening for other disorder: Secondary | ICD-10-CM | POA: Diagnosis not present

## 2020-09-08 DIAGNOSIS — Z793 Long term (current) use of hormonal contraceptives: Secondary | ICD-10-CM | POA: Diagnosis not present

## 2020-09-08 DIAGNOSIS — Z130 Encounter for screening for diseases of the blood and blood-forming organs and certain disorders involving the immune mechanism: Secondary | ICD-10-CM | POA: Diagnosis not present

## 2021-06-23 ENCOUNTER — Encounter (HOSPITAL_COMMUNITY): Payer: Self-pay

## 2021-07-19 ENCOUNTER — Other Ambulatory Visit: Payer: Self-pay

## 2021-07-20 ENCOUNTER — Ambulatory Visit (INDEPENDENT_AMBULATORY_CARE_PROVIDER_SITE_OTHER): Payer: BC Managed Care – PPO | Admitting: Family Medicine

## 2021-07-20 ENCOUNTER — Encounter: Payer: Self-pay | Admitting: Family Medicine

## 2021-07-20 VITALS — BP 100/68 | HR 111 | Temp 98.1°F | Ht 61.0 in | Wt 98.5 lb

## 2021-07-20 DIAGNOSIS — Z131 Encounter for screening for diabetes mellitus: Secondary | ICD-10-CM | POA: Diagnosis not present

## 2021-07-20 DIAGNOSIS — Z13220 Encounter for screening for lipoid disorders: Secondary | ICD-10-CM | POA: Diagnosis not present

## 2021-07-20 DIAGNOSIS — Z0000 Encounter for general adult medical examination without abnormal findings: Secondary | ICD-10-CM | POA: Diagnosis not present

## 2021-07-20 DIAGNOSIS — N920 Excessive and frequent menstruation with regular cycle: Secondary | ICD-10-CM

## 2021-07-20 LAB — COMPREHENSIVE METABOLIC PANEL
ALT: 8 U/L (ref 0–35)
AST: 14 U/L (ref 0–37)
Albumin: 4.4 g/dL (ref 3.5–5.2)
Alkaline Phosphatase: 73 U/L (ref 39–117)
BUN: 11 mg/dL (ref 6–23)
CO2: 28 mEq/L (ref 19–32)
Calcium: 9.8 mg/dL (ref 8.4–10.5)
Chloride: 102 mEq/L (ref 96–112)
Creatinine, Ser: 0.88 mg/dL (ref 0.40–1.20)
GFR: 93.67 mL/min (ref >60.00)
Glucose, Bld: 111 mg/dL — ABNORMAL HIGH (ref 70–99)
Potassium: 3.6 mEq/L (ref 3.5–5.1)
Sodium: 139 mEq/L (ref 135–145)
Total Bilirubin: 0.5 mg/dL (ref 0.2–1.2)
Total Protein: 7.7 g/dL (ref 6.0–8.3)

## 2021-07-20 LAB — LIPID PANEL
Cholesterol: 235 mg/dL — ABNORMAL HIGH (ref 0–200)
HDL: 69.2 mg/dL (ref >39.00)
LDL Cholesterol: 150 mg/dL — ABNORMAL HIGH (ref 0–99)
NonHDL: 165.44
Total CHOL/HDL Ratio: 3
Triglycerides: 79 mg/dL (ref 0.0–149.0)
VLDL: 15.8 mg/dL (ref 0.0–40.0)

## 2021-07-20 LAB — CBC WITH DIFFERENTIAL/PLATELET
Basophils Absolute: 0 10*3/uL (ref 0.0–0.1)
Basophils Relative: 0.7 % (ref 0.0–3.0)
Eosinophils Absolute: 0.1 10*3/uL (ref 0.0–0.7)
Eosinophils Relative: 1.1 % (ref 0.0–5.0)
HCT: 46.4 % — ABNORMAL HIGH (ref 36.0–46.0)
Hemoglobin: 15.6 g/dL — ABNORMAL HIGH (ref 12.0–15.0)
Lymphocytes Relative: 28.4 % (ref 12.0–46.0)
Lymphs Abs: 2 10*3/uL (ref 0.7–4.0)
MCHC: 33.6 g/dL (ref 30.0–36.0)
MCV: 87.2 fl (ref 78.0–100.0)
Monocytes Absolute: 0.4 10*3/uL (ref 0.1–1.0)
Monocytes Relative: 5.8 % (ref 3.0–12.0)
Neutro Abs: 4.5 10*3/uL (ref 1.4–7.7)
Neutrophils Relative %: 64 % (ref 43.0–77.0)
Platelets: 361 10*3/uL (ref 150.0–400.0)
RBC: 5.32 Mil/uL — ABNORMAL HIGH (ref 3.87–5.11)
RDW: 12.5 % (ref 11.5–15.5)
WBC: 7 10*3/uL (ref 4.0–10.5)

## 2021-07-20 LAB — TSH: TSH: 1.57 u[IU]/mL (ref 0.35–5.50)

## 2021-07-20 NOTE — Addendum Note (Signed)
Addended by: Amanda Cockayne on: 07/20/2021 02:52 PM   Modules accepted: Orders

## 2021-07-20 NOTE — Progress Notes (Signed)
Carolyn Palmer DOB: August 05, 2001 Encounter date: 07/20/2021  This is a 20 y.o. female who presents for complete physical   History of present illness/Additional concerns: No concerns today.   Sees gyn for annual exam - they are just making sure to examine yearly.   She is about to start senior year at [employer]. Majoring in biology. Wants to go into forensics.    History reviewed. No pertinent past medical history. Past Surgical History:  Procedure Laterality Date   BREAST LUMPECTOMY WITH RADIOACTIVE SEED LOCALIZATION Right 05/09/2020   Procedure: right breast seed localized lumpectomy;  Surgeon: Erroll Luna, MD;  Location: [place];  Service: General;  Laterality: Right;   WISDOM TOOTH EXTRACTION     No Known Allergies Current Meds  Medication Sig   ibuprofen (ADVIL) 800 MG tablet Take 1 tablet (800 mg total) by mouth every 8 (eight) hours as needed.   Levonorgestrel-Ethinyl Estrad (SRONYX PO) Take by mouth.   Social History   Tobacco Use   Smoking status: Never   Smokeless tobacco: Never  Substance Use Topics   Alcohol use: Yes    Comment: hard seltzer every other weekend while in college   Family History  Problem Relation Age of Onset   Hypertension Mother    Hypertension Father    Kidney Stones Father    Healthy Sister    Healthy Brother    Colon cancer Maternal Grandfather 97   Diabetes Maternal Grandfather    Diabetes Paternal Grandmother    Heart disease Paternal Grandfather      Review of Systems  Constitutional:  Negative for activity change, appetite change, chills, fatigue, fever and unexpected weight change.  HENT:  Negative for congestion, ear pain, hearing loss, sinus pressure, sinus pain, sore throat and trouble swallowing.   Eyes:  Negative for pain and visual disturbance.  Respiratory:  Negative for cough, chest tightness, shortness of breath and wheezing.   Cardiovascular:  Negative for chest pain, palpitations and leg  swelling.  Gastrointestinal:  Negative for abdominal pain, blood in stool, constipation, diarrhea, nausea and vomiting.  Genitourinary:  Negative for difficulty urinating and menstrual problem.  Musculoskeletal:  Negative for arthralgias and back pain.  Skin:  Negative for rash.  Neurological:  Negative for dizziness, weakness, numbness and headaches.  Hematological:  Negative for adenopathy. Does not bruise/bleed easily.  Psychiatric/Behavioral:  Negative for sleep disturbance and suicidal ideas. The patient is not nervous/anxious.    CBC: No results found for: WBC, HGB, HCT, MCH, MCHC, RDW, PLT, MPV CMP:No results found for: NA, K, CL, CO2, ANIONGAP, GLUCOSE, BUN, CREATININE, LABGLOB, GFRAA, CALCIUM, PROT, AGRATIO, BILITOT, ALKPHOS, ALT, AST, GLOB LIPID: Lab Results  Component Value Date   CHOL 199 05/27/2018   TRIG 93 05/27/2018   HDL 71 (A) 05/27/2018   LDLCALC 109 05/27/2018    Objective:  BP 100/68 (BP Location: Left Arm, Patient Position: Sitting, Cuff Size: Normal)   Pulse (!) 111   Temp 98.1 F (36.7 C) (Oral)   Ht '5\' 1"'$  (1.549 m)   Wt 98 lb 8 oz (44.7 kg)   SpO2 95%   BMI 18.61 kg/m   Weight: 98 lb 8 oz (44.7 kg)   BP Readings from Last 3 Encounters:  07/20/21 100/68  05/09/20 122/79  05/26/19 118/80   Wt Readings from Last 3 Encounters:  07/20/21 98 lb 8 oz (44.7 kg)  05/09/20 97 lb 3.6 oz (44.1 kg)  05/26/19 101 lb 3.2 oz (45.9 kg) (4 %,  Z= -1.71)*   * Growth percentiles are based on CDC (Girls, 2-20 Years) data.    Physical Exam Constitutional:      General: She is not in acute distress.    Appearance: She is well-developed.  HENT:     Head: Normocephalic and atraumatic.     Right Ear: External ear normal.     Left Ear: External ear normal.     Mouth/Throat:     Pharynx: No oropharyngeal exudate.  Eyes:     Conjunctiva/sclera: Conjunctivae normal.     Pupils: Pupils are equal, round, and reactive to light.  Neck:     Thyroid: No thyromegaly.   Cardiovascular:     Rate and Rhythm: Normal rate and regular rhythm.     Heart sounds: Normal heart sounds. No murmur heard.   No friction rub. No gallop.  Pulmonary:     Effort: Pulmonary effort is normal.     Breath sounds: Normal breath sounds.  Abdominal:     General: Bowel sounds are normal. There is no distension.     Palpations: Abdomen is soft. There is no mass.     Tenderness: There is no abdominal tenderness. There is no guarding.     Hernia: No hernia is present.  Musculoskeletal:        General: No tenderness or deformity. Normal range of motion.     Cervical back: Normal range of motion and neck supple.  Lymphadenopathy:     Cervical: No cervical adenopathy.  Skin:    General: Skin is warm and dry.     Findings: No rash.  Neurological:     Mental Status: She is alert and oriented to person, place, and time.     Deep Tendon Reflexes: Reflexes normal.     Reflex Scores:      Tricep reflexes are 2+ on the right side and 2+ on the left side.      Bicep reflexes are 2+ on the right side and 2+ on the left side.      Brachioradialis reflexes are 2+ on the right side and 2+ on the left side.      Patellar reflexes are 2+ on the right side and 2+ on the left side. Psychiatric:        Speech: Speech normal.        Behavior: Behavior normal.        Thought Content: Thought content normal.    Assessment/Plan: Health Maintenance Due  Topic Date Due   CHLAMYDIA SCREENING  Never done   Hepatitis C Screening  Never done   COVID-19 Vaccine (2 - Janssen risk series) 03/13/2020   PAP-Cervical Cytology Screening  Never done   TETANUS/TDAP  02/24/2021   INFLUENZA VACCINE  07/16/2021   Health Maintenance reviewed.  1. Preventative health care Keep up with regular exercise.  - HIV Antibody (routine testing w rflx); Future - Hepatitis C antibody; Future  2. Menorrhagia with regular cycle Periods regular on ocp. Follows with gyn. - CBC with Differential/Platelet; Future -  TSH; Future - Iron and TIBC; Future - Ferritin; Future  3. Lipid screening - Lipid panel; Future  4. Screening for diabetes mellitus - Comprehensive metabolic panel; Future  Return in about 1 year (around 07/20/2022) for physical exam.  Micheline Rough, MD

## 2021-07-20 NOTE — Addendum Note (Signed)
Addended by: Amanda Cockayne on: 07/20/2021 03:02 PM   Modules accepted: Orders

## 2021-07-20 NOTE — Patient Instructions (Signed)
I'll be in touch with lab results once I get them.

## 2021-07-24 LAB — HIV ANTIBODY (ROUTINE TESTING W REFLEX): HIV 1&2 Ab, 4th Generation: NONREACTIVE

## 2021-07-24 LAB — IRON,TIBC AND FERRITIN PANEL
%SAT: 19 % (calc) (ref 16–45)
Ferritin: 10 ng/mL — ABNORMAL LOW (ref 16–154)
Iron: 93 ug/dL (ref 40–190)
TIBC: 489 mcg/dL (calc) — ABNORMAL HIGH (ref 250–450)

## 2021-07-24 LAB — HEPATITIS C ANTIBODY
Hepatitis C Ab: NONREACTIVE
SIGNAL TO CUT-OFF: 0.01 (ref <1.00)

## 2021-09-27 DIAGNOSIS — Z124 Encounter for screening for malignant neoplasm of cervix: Secondary | ICD-10-CM | POA: Diagnosis not present

## 2021-09-27 DIAGNOSIS — Z1389 Encounter for screening for other disorder: Secondary | ICD-10-CM | POA: Diagnosis not present

## 2021-09-27 DIAGNOSIS — Z130 Encounter for screening for diseases of the blood and blood-forming organs and certain disorders involving the immune mechanism: Secondary | ICD-10-CM | POA: Diagnosis not present

## 2021-09-27 DIAGNOSIS — Z01419 Encounter for gynecological examination (general) (routine) without abnormal findings: Secondary | ICD-10-CM | POA: Diagnosis not present

## 2021-12-13 DIAGNOSIS — Z681 Body mass index (BMI) 19 or less, adult: Secondary | ICD-10-CM | POA: Diagnosis not present

## 2021-12-13 DIAGNOSIS — Z113 Encounter for screening for infections with a predominantly sexual mode of transmission: Secondary | ICD-10-CM | POA: Diagnosis not present

## 2021-12-13 DIAGNOSIS — N760 Acute vaginitis: Secondary | ICD-10-CM | POA: Diagnosis not present

## 2021-12-13 DIAGNOSIS — R8290 Unspecified abnormal findings in urine: Secondary | ICD-10-CM | POA: Diagnosis not present

## 2021-12-13 DIAGNOSIS — M545 Low back pain, unspecified: Secondary | ICD-10-CM | POA: Diagnosis not present

## 2022-11-06 DIAGNOSIS — Z130 Encounter for screening for diseases of the blood and blood-forming organs and certain disorders involving the immune mechanism: Secondary | ICD-10-CM | POA: Diagnosis not present

## 2022-11-06 DIAGNOSIS — Z01419 Encounter for gynecological examination (general) (routine) without abnormal findings: Secondary | ICD-10-CM | POA: Diagnosis not present

## 2023-02-10 ENCOUNTER — Ambulatory Visit (INDEPENDENT_AMBULATORY_CARE_PROVIDER_SITE_OTHER): Payer: BC Managed Care – PPO | Admitting: Family Medicine

## 2023-02-10 ENCOUNTER — Encounter: Payer: Self-pay | Admitting: Family Medicine

## 2023-02-10 VITALS — BP 110/82 | HR 78 | Temp 98.2°F | Ht 61.0 in | Wt 105.1 lb

## 2023-02-10 DIAGNOSIS — R109 Unspecified abdominal pain: Secondary | ICD-10-CM

## 2023-02-10 DIAGNOSIS — Z0001 Encounter for general adult medical examination with abnormal findings: Secondary | ICD-10-CM | POA: Diagnosis not present

## 2023-02-10 DIAGNOSIS — Z0000 Encounter for general adult medical examination without abnormal findings: Secondary | ICD-10-CM

## 2023-02-10 LAB — COMPREHENSIVE METABOLIC PANEL
ALT: 12 U/L (ref 0–35)
AST: 19 U/L (ref 0–37)
Albumin: 4.2 g/dL (ref 3.5–5.2)
Alkaline Phosphatase: 72 U/L (ref 39–117)
BUN: 14 mg/dL (ref 6–23)
CO2: 25 mEq/L (ref 19–32)
Calcium: 9.8 mg/dL (ref 8.4–10.5)
Chloride: 102 mEq/L (ref 96–112)
Creatinine, Ser: 0.83 mg/dL (ref 0.40–1.20)
GFR: 99.38 mL/min (ref >60.00)
Glucose, Bld: 90 mg/dL (ref 70–99)
Potassium: 4 mEq/L (ref 3.5–5.1)
Sodium: 137 mEq/L (ref 135–145)
Total Bilirubin: 0.5 mg/dL (ref 0.2–1.2)
Total Protein: 7.3 g/dL (ref 6.0–8.3)

## 2023-02-10 LAB — CBC
HCT: 42.8 % (ref 36.0–46.0)
Hemoglobin: 14.7 g/dL (ref 12.0–15.0)
MCHC: 34.3 g/dL (ref 30.0–36.0)
MCV: 87.3 fl (ref 78.0–100.0)
Platelets: 340 10*3/uL (ref 150.0–400.0)
RBC: 4.9 Mil/uL (ref 3.87–5.11)
RDW: 12.7 % (ref 11.5–15.5)
WBC: 6.5 10*3/uL (ref 4.0–10.5)

## 2023-02-10 LAB — LIPASE: Lipase: 6 U/L — ABNORMAL LOW (ref 11.0–59.0)

## 2023-02-10 MED ORDER — DICYCLOMINE HCL 20 MG PO TABS: 20.0000 mg | ORAL_TABLET | Freq: Four times a day (QID) | ORAL | 0 refills | Status: DC

## 2023-02-10 NOTE — Assessment & Plan Note (Addendum)
Pt describes a definite association with eating food -- differential includes gastritis vs GB disease. Will order testing to include bloodwork and abdominal films to look at the bowel gas pattern. Will treat the abdominal cramping with dicyclomine 20 mg every 6 hours as needed.

## 2023-02-10 NOTE — Progress Notes (Signed)
Complete physical exam  Patient: Carolyn Palmer   DOB: 18-Feb-2001   22 y.o. Female  MRN: [id number]  Subjective:    Chief Complaint  Patient presents with   [place]    Carolyn Palmer is a 22 y.o. female who presents today for a complete physical exam. She reports consuming a general diet. Home exercise routine includes walking daily. She generally feels well. She reports sleeping well. She does have additional problems to discuss today.   Patient is reporting a 6 month history of "stomach issues". States that she is having some cramping/discomfort after she eats a meal. Not all the time, just intermittent. States that a couple of time it was very severe, sometimes it almost makes her sick. Happens mostly at night.  States that it will wake her from sleep. Pt reports normal BM's, no constipation or diarrhea. No fever or chills, no weight loss recently. Appetite is still normal. No acid reflux symptoms. States it is mostly her lower back area that is cramping.   Pt states she will take antacids, ibuprofen when she gets the pain.  Most recent fall risk assessment:     No data to display           Most recent depression screenings:    02/10/2023    9:41 AM 07/20/2021    2:53 PM  PHQ 2/9 Scores  PHQ - 2 Score 0 0  PHQ- 9 Score 0 0    Dental: No current dental problems and Receives regular dental care  Patient Active Problem List   Diagnosis Date Noted   Abdominal cramping 02/10/2023      Patient Care Team: Farrel Conners, MD as PCP - General (Family Medicine)   Outpatient Medications Prior to Visit  Medication Sig   HAILEY 24 FE 1-20 MG-MCG(24) tablet Take 1 tablet by mouth daily.   ibuprofen (ADVIL) 800 MG tablet Take 1 tablet (800 mg total) by mouth every 8 (eight) hours as needed.   [DISCONTINUED] Levonorgestrel-Ethinyl Estrad (SRONYX PO) Take by mouth.   No facility-administered medications prior to visit.    Review of Systems  Constitutional:   Negative for chills, fever and weight loss.  HENT:  Negative for sore throat.   Respiratory:  Negative for shortness of breath.   Gastrointestinal:  Positive for abdominal pain and nausea. Negative for blood in stool, constipation, diarrhea, heartburn and vomiting.  Genitourinary:  Negative for dysuria.  Musculoskeletal:  Negative for myalgias.  Neurological:  Negative for focal weakness.  Psychiatric/Behavioral:  Negative for depression. The patient is not nervous/anxious.   All other systems reviewed and are negative.         Objective:     BP 110/82 (BP Location: Left Arm, Patient Position: Sitting, Cuff Size: Normal)   Pulse 78   Temp 98.2 F (36.8 C) (Oral)   Ht '5\' 1"'$  (1.549 m)   Wt 105 lb 1.6 oz (47.7 kg)   LMP 11/29/2022 (Approximate)   SpO2 99%   BMI 19.86 kg/m    Physical Exam Vitals reviewed.  Constitutional:      Appearance: Normal appearance. She is well-groomed and normal weight.  HENT:     Right Ear: Tympanic membrane and ear canal normal.     Left Ear: Tympanic membrane and ear canal normal.     Mouth/Throat:     Mouth: Mucous membranes are moist.     Pharynx: No posterior oropharyngeal erythema.  Eyes:     Conjunctiva/sclera: Conjunctivae normal.  Neck:     Thyroid: No thyromegaly.  Cardiovascular:     Rate and Rhythm: Normal rate and regular rhythm.     Pulses: Normal pulses.     Heart sounds: S1 normal and S2 normal. No murmur heard. Pulmonary:     Effort: Pulmonary effort is normal.     Breath sounds: Normal breath sounds and air entry. No wheezing.  Abdominal:     General: Abdomen is flat. Bowel sounds are normal. There is no distension.     Palpations: Abdomen is soft. There is no mass.     Tenderness: There is no abdominal tenderness. There is no guarding or rebound.  Musculoskeletal:        General: Normal range of motion.     Cervical back: Normal range of motion.     Right lower leg: No edema.     Left lower leg: No edema.   Neurological:     Mental Status: She is alert and oriented to person, place, and time. Mental status is at baseline.     Gait: Gait is intact.  Psychiatric:        Mood and Affect: Mood and affect normal.        Speech: Speech normal.        Behavior: Behavior normal.        Judgment: Judgment normal.      Results for orders placed or performed in visit on 02/10/23  CMP  Result Value Ref Range   Sodium 137 135 - 145 mEq/L   Potassium 4.0 3.5 - 5.1 mEq/L   Chloride 102 96 - 112 mEq/L   CO2 25 19 - 32 mEq/L   Glucose, Bld 90 70 - 99 mg/dL   BUN 14 6 - 23 mg/dL   Creatinine, Ser 0.83 0.40 - 1.20 mg/dL   Total Bilirubin 0.5 0.2 - 1.2 mg/dL   Alkaline Phosphatase 72 39 - 117 U/L   AST 19 0 - 37 U/L   ALT 12 0 - 35 U/L   Total Protein 7.3 6.0 - 8.3 g/dL   Albumin 4.2 3.5 - 5.2 g/dL   GFR 99.38 >60.00 mL/min   Calcium 9.8 8.4 - 10.5 mg/dL  CBC (no diff)  Result Value Ref Range   WBC 6.5 4.0 - 10.5 K/uL   RBC 4.90 3.87 - 5.11 Mil/uL   Platelets 340.0 150.0 - 400.0 K/uL   Hemoglobin 14.7 12.0 - 15.0 g/dL   HCT 42.8 36.0 - 46.0 %   MCV 87.3 78.0 - 100.0 fl   MCHC 34.3 30.0 - 36.0 g/dL   RDW 12.7 11.5 - 15.5 %  Lipase  Result Value Ref Range   Lipase 6.0 (L) 11.0 - 59.0 U/L       Assessment & Plan:    Routine Health Maintenance and Physical Exam  Immunization History  Administered Date(s) Administered   DTaP 11/20/1999, 01/22/2000, 03/19/2000, 03/25/2001, 05/17/2004   HIB (PRP-OMP) 07-Feb-1999, 01/22/2000, 03/19/2000, 03/25/2001   Hepatitis A 12/06/2009, 02/25/2011   Hepatitis B 1998/12/25, 05/20/2000, 10/22/2000   Hpv-Unspecified 01/05/2015, 03/07/2015, 07/21/2015   IPV 09-11-1999, 01/22/2000, 03/19/2000, 05/17/2004   Janssen (J&J) SARS-COV-2 Vaccination 02/14/2020   MMR 12/29/2000, 05/17/2004   Meningococcal B, OMV 07/15/2017, 05/27/2018   Meningococcal Conjugate 02/25/2011, 07/15/2017   PFIZER(Purple Top)SARS-COV-2 Vaccination 12/06/2020   Pneumococcal  Conjugate-13 01/22/2000, 03/19/2000, 05/20/2000, 08/12/2007   Td 02/25/2011   Tdap 02/25/2011, 02/10/2021   Varicella 09/19/2000, 12/06/2009    Health Maintenance  Topic Date Due   CHLAMYDIA  SCREENING  Never done   PAP-Cervical Cytology Screening  Never done   COVID-19 Vaccine (3 - 2023-24 season) 02/26/2023 (Originally 08/16/2022)   INFLUENZA VACCINE  03/17/2023 (Originally 07/16/2022)   PAP SMEAR-Modifier  09/15/2024   DTaP/Tdap/Td (9 - Td or Tdap) 02/10/2031   HPV VACCINES  Completed   Hepatitis C Screening  Completed   HIV Screening  Completed    Discussed health benefits of physical activity, and encouraged her to engage in regular exercise appropriate for her age and condition.  Problem List Items Addressed This Visit       Unprioritized   Abdominal cramping    Pt describes a definite association with eating food -- differential includes gastritis vs GB disease. Will order testing to include bloodwork and abdominal films to look at the bowel gas pattern. Will treat the abdominal cramping with dicyclomine 20 mg every 6 hours as needed.      Relevant Medications   dicyclomine (BENTYL) 20 MG tablet   Other Relevant Orders   CMP (Completed)   CBC (no diff) (Completed)   Lipase (Completed)   DG Abd 2 Views   Other Visit Diagnoses     Preventative health care    -  Primary   physical exam findings are relatively normal today, see below for additional discussion of the abdominal cramping.      Return in about 1 year (around 02/11/2024) for annual physical exam.     Farrel Conners, MD

## 2023-02-10 NOTE — Patient Instructions (Addendum)
Pepcid 20 mg twice a day as needed   Mylicon as needed if it's gas pain

## 2023-02-17 ENCOUNTER — Other Ambulatory Visit: Payer: Self-pay | Admitting: Family Medicine

## 2023-02-17 DIAGNOSIS — R109 Unspecified abdominal pain: Secondary | ICD-10-CM

## 2023-11-07 DIAGNOSIS — Z111 Encounter for screening for respiratory tuberculosis: Secondary | ICD-10-CM | POA: Diagnosis not present

## 2023-11-10 DIAGNOSIS — Z111 Encounter for screening for respiratory tuberculosis: Secondary | ICD-10-CM | POA: Diagnosis not present

## 2024-01-30 DIAGNOSIS — Z202 Contact with and (suspected) exposure to infections with a predominantly sexual mode of transmission: Secondary | ICD-10-CM | POA: Diagnosis not present

## 2024-01-30 DIAGNOSIS — Z124 Encounter for screening for malignant neoplasm of cervix: Secondary | ICD-10-CM | POA: Diagnosis not present

## 2024-01-30 DIAGNOSIS — Z01419 Encounter for gynecological examination (general) (routine) without abnormal findings: Secondary | ICD-10-CM | POA: Diagnosis not present

## 2024-01-30 DIAGNOSIS — Z113 Encounter for screening for infections with a predominantly sexual mode of transmission: Secondary | ICD-10-CM | POA: Diagnosis not present

## 2024-01-30 DIAGNOSIS — Z130 Encounter for screening for diseases of the blood and blood-forming organs and certain disorders involving the immune mechanism: Secondary | ICD-10-CM | POA: Diagnosis not present

## 2024-02-25 DIAGNOSIS — N760 Acute vaginitis: Secondary | ICD-10-CM | POA: Diagnosis not present

## 2024-04-02 DIAGNOSIS — R21 Rash and other nonspecific skin eruption: Secondary | ICD-10-CM | POA: Diagnosis not present

## 2024-06-15 DIAGNOSIS — J029 Acute pharyngitis, unspecified: Secondary | ICD-10-CM | POA: Diagnosis not present

## 2024-08-27 DIAGNOSIS — D225 Melanocytic nevi of trunk: Secondary | ICD-10-CM | POA: Diagnosis not present
